# Patient Record
Sex: Male | Born: 2000 | Race: White | Marital: Single | Smoking: Never smoker
Health system: Southern US, Community
[De-identification: ages and names within clinical notes are randomized; demographics above are authoritative.]

## PROBLEM LIST (undated history)

## (undated) DIAGNOSIS — R1310 Dysphagia, unspecified: Secondary | ICD-10-CM

## (undated) DIAGNOSIS — K200 Eosinophilic esophagitis: Secondary | ICD-10-CM

## (undated) DIAGNOSIS — F988 Other specified behavioral and emotional disorders with onset usually occurring in childhood and adolescence: Secondary | ICD-10-CM

## (undated) DIAGNOSIS — F909 Attention-deficit hyperactivity disorder, unspecified type: Secondary | ICD-10-CM

## (undated) HISTORY — DX: Attention-deficit hyperactivity disorder, unspecified type: F90.9

## (undated) HISTORY — DX: Dysphagia, unspecified: R13.10

## (undated) HISTORY — DX: Eosinophilic esophagitis: K20.0

## (undated) HISTORY — DX: Other specified behavioral and emotional disorders with onset usually occurring in childhood and adolescence: F98.8

---
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
## 2022-10-27 ENCOUNTER — Emergency Department (HOSPITAL_COMMUNITY)
Admission: EM | Admit: 2022-10-27 | Discharge: 2022-10-27 | Disposition: A | Discharge status: Home / Self Care | Payer: BLUE CROSS/BLUE SHIELD | Attending: Emergency Medicine | Admitting: Emergency Medicine

## 2022-10-27 ENCOUNTER — Other Ambulatory Visit: Payer: Self-pay

## 2022-10-27 DIAGNOSIS — K200 Eosinophilic esophagitis: Secondary | ICD-10-CM

## 2022-10-27 DIAGNOSIS — X58XXXA Exposure to other specified factors, initial encounter: Secondary | ICD-10-CM | POA: Insufficient documentation

## 2022-10-27 DIAGNOSIS — T18128A Food in esophagus causing other injury, initial encounter: Secondary | ICD-10-CM

## 2022-10-27 MED ORDER — PANTOPRAZOLE SODIUM 40 MG PO TBEC: 40.0000 mg | DELAYED_RELEASE_TABLET | Freq: Every day | ORAL | 1 refills | Status: DC

## 2022-10-27 NOTE — Discharge Instructions (Signed)
Oftentimes with your condition you will have difficulty swallowing food, I would strongly recommend that you take a soft diet, chew up your food extremely well and take lots of water to help wash it down.  You have a condition called eosinophilic esophagitis which is somewhat chronic, you will need to start taking a medication called Protonix which I have sent to your pharmacy, take 1 pill a day, take it at the same time every day.  Please call the office of Dr. Jena Gauss, he is the gastroenterologist on-call today and will be able to follow with you in the office.  Emergency department for severe or worsening symptoms

## 2022-10-27 NOTE — ED Triage Notes (Addendum)
Pt states he was eating a chicken enchilada when some got stuck in is throat. Pt is able to talk in full sentences and denies any difficulty breathing but cannot swallow. Reports he has his esophagus stretched in 2016.

## 2022-10-27 NOTE — ED Notes (Signed)
ED Provider at bedside. 

## 2022-10-27 NOTE — ED Provider Notes (Signed)
[place] [facility] AT [facility] Provider Note   CSN: [id number] Arrival date & time: 10/27/22  1929     History  No chief complaint on file.   Kyle Everett is a 22 y.o. male.  HPI   22 year old male history of prior esophageal dilation for stricture, seen by Dr. Dionicia Abler in 2016 after having 10 months of solid food dysphagia, no obvious narrowing or stricture was noted, the patient had what appeared to be esophageal eosinophilic esophagitis without a dominant stricture or narrowing, he did undergo an dilation with a 48 [place] Maloney dilator at that time.  He states that he required dilation again in 2020 when he was living in [place].  Tonight he was trying to eat chicken enchiladas, when he chewed it up well and swallowed it he felt like it got stuck in his throat and he has not been able to swallow even his saliva since that time.  Is been about 2 hours since that occurred.  He denies any other symptoms  Home Medications Prior to Admission medications   Medication Sig Start Date End Date Taking? Authorizing Provider  pantoprazole (PROTONIX) 40 MG tablet Take 1 tablet (40 mg total) by mouth daily. 10/27/22 12/26/22 Yes Eber Hong, MD      Allergies    Patient has no known allergies.    Review of Systems   Review of Systems  All other systems reviewed and are negative.   Physical Exam Updated Vital Signs BP (!) 129/90   Pulse 85   Temp 97.8 F (36.6 C)   Resp 18   Ht 1.753 m (5\' 9" )   Wt 90.3 kg   SpO2 98%   BMI 29.39 kg/m  Physical Exam Vitals and nursing note reviewed.  Constitutional:      General: He is not in acute distress.    Appearance: He is well-developed.  HENT:     Head: Normocephalic and atraumatic.     Mouth/Throat:     Pharynx: No oropharyngeal exudate.  Eyes:     General: No scleral icterus.       Right eye: No discharge.        Left eye: No discharge.     Conjunctiva/sclera: Conjunctivae normal.     Pupils: Pupils are  equal, round, and reactive to light.  Neck:     Thyroid: No thyromegaly.     Vascular: No JVD.  Cardiovascular:     Rate and Rhythm: Normal rate and regular rhythm.     Heart sounds: Normal heart sounds. No murmur heard.    No friction rub. No gallop.  Pulmonary:     Effort: Pulmonary effort is normal. No respiratory distress.     Breath sounds: Normal breath sounds. No wheezing or rales.  Abdominal:     General: Bowel sounds are normal. There is no distension.     Palpations: Abdomen is soft. There is no mass.     Tenderness: There is no abdominal tenderness.  Musculoskeletal:        General: No tenderness. Normal range of motion.     Cervical back: Normal range of motion and neck supple.     Right lower leg: No edema.     Left lower leg: No edema.  Lymphadenopathy:     Cervical: No cervical adenopathy.  Skin:    General: Skin is warm and dry.     Findings: No erythema or rash.  Neurological:     Mental Status: He is  alert.     Coordination: Coordination normal.  Psychiatric:        Behavior: Behavior normal.     ED Results / Procedures / Treatments   Labs (all labs ordered are listed, but only abnormal results are displayed) Labs Reviewed - No data to display  EKG None  Radiology No results found.  Procedures NG placement  Date/Time: 10/27/2022 8:04 PM  Performed by: Eber Hong, MD Authorized by: Eber Hong, MD  Consent: Verbal consent obtained. Risks and benefits: risks, benefits and alternatives were discussed Consent given by: patient Required items: required blood products, implants, devices, and special equipment available Patient identity confirmed: verbally with patient Time out: Immediately prior to procedure a "time out" was called to verify the correct patient, procedure, equipment, support staff and site/side marked as required. Local anesthesia used: no  Anesthesia: Local anesthesia used: no  Sedation: Patient sedated: no  Patient  tolerance: patient tolerated the procedure well with no immediate complications Comments: G-tube was placed and then removed successfully        Medications Ordered in ED Medications - No data to display  ED Course/ Medical Decision Making/ A&P                             Medical Decision Making Risk Prescription drug management.    This patient presents to the ED for concern of esophageal foreign body differential diagnosis includes stricture, eosinophilic esophagitis    Additional history obtained:  Additional history obtained from medical record External records from outside source obtained and reviewed including prior endoscopy notes from 2016   Lab Tests:  N/a  Imaging Studies ordered:  N/a   Medicines ordered and prescription drug management:  Prescribed Protonix I have reviewed the patients home medicines and have made adjustments as needed   Problem List / ED Course:  The patient was placed in a semirecumbent position, he agreed to attempt at NG tube manipulation of the food bolus.  On the first attempt going through the right nostril the patient was able to allow me to pass the NG tube into the esophagus, the NG tube was removed and the patient is now able to tolerate liquids, he has had a full glass of water with no nausea, he is tolerating secretions, feels like the foreign body is gone and feels better.  He request to follow-up in the outpatient setting, I think this is reasonable Given GI phone number and prescription for pantoprazole   Social Determinants of Health:  Known eosinophilic esophagitis           Final Clinical Impression(s) / ED Diagnoses Final diagnoses:  Eosinophilic esophagitis  Food impaction of esophagus, initial encounter    Rx / DC Orders ED Discharge Orders          Ordered    pantoprazole (PROTONIX) 40 MG tablet  Daily        10/27/22 1958              Eber Hong, MD 10/27/22 2004

## 2022-10-27 NOTE — ED Notes (Signed)
Pt in attached bathroom, clearing his throat. States he feels much better now.

## 2022-11-05 ENCOUNTER — Telehealth (INDEPENDENT_AMBULATORY_CARE_PROVIDER_SITE_OTHER): Payer: Self-pay | Admitting: Gastroenterology

## 2022-11-05 ENCOUNTER — Emergency Department (HOSPITAL_COMMUNITY)
Admission: EM | Admit: 2022-11-05 | Discharge: 2022-11-05 | Disposition: A | Discharge status: Home / Self Care | Payer: Self-pay | Attending: Emergency Medicine | Admitting: Emergency Medicine

## 2022-11-05 ENCOUNTER — Other Ambulatory Visit: Payer: Self-pay

## 2022-11-05 ENCOUNTER — Encounter (HOSPITAL_COMMUNITY): Payer: Self-pay

## 2022-11-05 DIAGNOSIS — T18120A Food in esophagus causing compression of trachea, initial encounter: Secondary | ICD-10-CM | POA: Insufficient documentation

## 2022-11-05 DIAGNOSIS — W44F3XA Food entering into or through a natural orifice, initial encounter: Secondary | ICD-10-CM | POA: Insufficient documentation

## 2022-11-05 LAB — CBC WITH DIFFERENTIAL/PLATELET
Abs Immature Granulocytes: 0.02 10*3/uL (ref 0.00–0.07)
Basophils Absolute: 0.1 10*3/uL (ref 0.0–0.1)
Basophils Relative: 1 %
Eosinophils Absolute: 0.8 10*3/uL — ABNORMAL HIGH (ref 0.0–0.5)
Eosinophils Relative: 8 %
HCT: 44.7 % (ref 39.0–52.0)
Hemoglobin: 15.1 g/dL (ref 13.0–17.0)
Immature Granulocytes: 0 %
Lymphocytes Relative: 27 %
Lymphs Abs: 2.7 10*3/uL (ref 0.7–4.0)
MCH: 29.3 pg (ref 26.0–34.0)
MCHC: 33.8 g/dL (ref 30.0–36.0)
MCV: 86.6 fL (ref 80.0–100.0)
Monocytes Absolute: 0.6 10*3/uL (ref 0.1–1.0)
Monocytes Relative: 6 %
Neutro Abs: 5.6 10*3/uL (ref 1.7–7.7)
Neutrophils Relative %: 58 %
Platelets: 272 10*3/uL (ref 150–400)
RBC: 5.16 MIL/uL (ref 4.22–5.81)
RDW: 12.8 % (ref 11.5–15.5)
WBC: 9.8 10*3/uL (ref 4.0–10.5)
nRBC: 0 % (ref 0.0–0.2)

## 2022-11-05 LAB — BASIC METABOLIC PANEL
Anion gap: 6 (ref 5–15)
BUN: 7 mg/dL (ref 6–20)
CO2: 25 mmol/L (ref 22–32)
Calcium: 9 mg/dL (ref 8.9–10.3)
Chloride: 106 mmol/L (ref 98–111)
Creatinine, Ser: 0.89 mg/dL (ref 0.61–1.24)
GFR, Estimated: >60 mL/min (ref >60)
Glucose, Bld: 94 mg/dL (ref 70–99)
Potassium: 4.1 mmol/L (ref 3.5–5.1)
Sodium: 137 mmol/L (ref 135–145)

## 2022-11-05 MED ORDER — PANTOPRAZOLE SODIUM 40 MG PO TBEC: 40.0000 mg | DELAYED_RELEASE_TABLET | Freq: Two times a day (BID) | ORAL | 1 refills | Status: DC

## 2022-11-05 MED ORDER — GLUCAGON HCL RDNA (DIAGNOSTIC) 1 MG IJ SOLR
1.0000 mg | Freq: Once | INTRAMUSCULAR | Status: AC
  Administered 2022-11-05: 1 mg via INTRAVENOUS
  Filled 2022-11-05: qty 1

## 2022-11-05 NOTE — Telephone Encounter (Signed)
Thanks

## 2022-11-05 NOTE — ED Triage Notes (Signed)
Pt reports hx of esophageal stretching, ate nutella cookies about 4am this morning and feels like it is stuck.

## 2022-11-05 NOTE — Discharge Instructions (Addendum)
It was a pleasure taking care of you today!   Your labs didn't show any concerning findings at this time.   It is important that you call the GI specialist at [facility] today to set up an appointment within the next week.   Also, you will take your protonix twice daily. Ensure that you maintain a liquid diet consistent of water, tea, broth, pedialyte, gatorade. It is recommended that you refrain from solids at this time until you are evaluated by the GI specialist.   Return to the ED if you are experiencing increasing/worsening symptoms.

## 2022-11-05 NOTE — ED Notes (Signed)
Pt given water to see if he can tolerate PO liquids.

## 2022-11-05 NOTE — ED Notes (Signed)
Pt denies any difficulty breathing, able to talk in complete sentences. Speech is clear, respirations regular and unlabored.

## 2022-11-05 NOTE — Telephone Encounter (Signed)
Hi Mitzie,  Can you please schedule a NEW PATIENT appointment for this patient in an appointment withing 7 days with Dr. Tasia Catchings, Marletta Lor or me?  Thanks,  Katrinka Blazing, MD Gastroenterology and Hepatology [facility]

## 2022-11-05 NOTE — ED Provider Notes (Signed)
[place] EMERGENCY DEPARTMENT AT [facility] Provider Note   CSN: [id number] Arrival date & time: 11/05/22  1009     History  Chief Complaint  Patient presents with   food stuck in throat    Kyle Everett is a 22 y.o. male who presents to the ED with concerns for food bolus onset 3 AM. Pt notes that he ate nutella cookies at 3 AM and noted concerns for food bolus. Has been able to tolerate fluids. No meds tried PTA. Denies shortness of breath, sore throat.   The history is provided by the patient. No language interpreter was used.       Home Medications Prior to Admission medications   Medication Sig Start Date End Date Taking? Authorizing Provider  pantoprazole (PROTONIX) 40 MG tablet Take 1 tablet (40 mg total) by mouth 2 (two) times daily. 11/05/22 01/04/23  Estephany Perot A, PA-C      Allergies    Patient has no known allergies.    Review of Systems   Review of Systems  All other systems reviewed and are negative.   Physical Exam Updated Vital Signs BP 135/87 (BP Location: Right Arm)   Pulse (!) 49   Temp 97.9 F (36.6 C) (Oral)   Resp 19   Ht 5\' 9"  (1.753 m)   Wt 89.8 kg   SpO2 100%   BMI 29.24 kg/m  Physical Exam Vitals and nursing note reviewed.  Constitutional:      General: He is not in acute distress.    Appearance: Normal appearance.  HENT:     Mouth/Throat:     Comments: Uvula midline without swelling. No posterior pharyngeal erythema or tonsillar exudate noted. Patent airway. Pt able to speak in clear complete sentences. Tolerating oral secretions. Eyes:     General: No scleral icterus.    Extraocular Movements: Extraocular movements intact.  Cardiovascular:     Rate and Rhythm: Normal rate and regular rhythm.     Pulses: Normal pulses.     Heart sounds: Normal heart sounds.  Pulmonary:     Effort: Pulmonary effort is normal. No respiratory distress.     Breath sounds: Normal breath sounds.     Comments: Able to speak in clear  complete sentences.  Abdominal:     Palpations: Abdomen is soft. There is no mass.     Tenderness: There is no abdominal tenderness.  Musculoskeletal:        General: Normal range of motion.     Cervical back: Neck supple.  Skin:    General: Skin is warm and dry.     Findings: No rash.  Neurological:     Mental Status: He is alert.     Sensory: Sensation is intact.     Motor: Motor function is intact.  Psychiatric:        Behavior: Behavior normal.     ED Results / Procedures / Treatments   Labs (all labs ordered are listed, but only abnormal results are displayed) Labs Reviewed  CBC WITH DIFFERENTIAL/PLATELET - Abnormal; Notable for the following components:      Result Value   Eosinophils Absolute 0.8 (*)    All other components within normal limits  BASIC METABOLIC PANEL    EKG None  Radiology No results found.  Procedures Procedures    Medications Ordered in ED Medications  glucagon (human recombinant) (GLUCAGEN) injection 1 mg (1 mg Intravenous Given 11/05/22 1241)    ED Course/ Medical Decision Making/ A&P Clinical  Course as of 11/05/22 1547  Tue Nov 05, 2022  1236 Pt evaluated and discussed with patient use of glucagon in the emergency department prior to GI consult.  Patient agreeable to glucagon at this time. [SB]  1420 Re-evaluated and noted that he feels the same [SB]  1513 Pt re-evaluated and noted to be able to consume water without difficulty. Discussed with aptient and mother regarding plans for GI consult. Pt and mother agreeable at this itme.  [SB]  1520 Consult to GI, Dr. Levon Hedger who recommends Protonix BID, liquid diet, and follow up in office. [SB]  1527 Discussed with patient plans as per GI specialist. Answered all available questions. Pt appears safe for discharge at this time.  [SB]    Clinical Course User Index [SB] Nolan Tuazon A, PA-C                             Medical Decision Making Amount and/or Complexity of Data  Reviewed Labs: ordered.  Risk Prescription drug management.   Pt presents with concerns for food bolus onset at 3 AM.  Has a history of similar symptoms.  Patient notes that he has been able to tolerate fluids.  Patient afebrile.  On exam patient with Uvula midline without swelling. No posterior pharyngeal erythema or tonsillar exudate noted. Patent airway. Pt able to speak in clear complete sentences. Tolerating oral secretions. Differential diagnosis includes eosinophilic esophagitis, food bolus.    Additional history obtained:  External records from outside source obtained and reviewed including: Patient was evaluated in the emergency department on 10/27/2022 for similar symptoms.  At that time had NG tube placed and removed and was discharged home with information for GI follow-up.  Labs:  I ordered, and personally interpreted labs.  The pertinent results include:   BMP and CBC unremarkable   Medications:  I ordered medication including glucagon for management Reevaluation of the patient after these medicines and interventions, I reevaluated the patient and found that they have stayed the same I have reviewed the patients home medicines and have made adjustments as needed Patient able to tolerate p.o. fluids in the emergency department without difficulty   Consultations: I requested consultation with the Gastroenterologist, and discussed lab and imaging findings as well as pertinent plan - they recommend: Protonix twice daily, liquid diet, follow-up in the office  Disposition: Presentation suspicious for food bolus.  O2 sats at 100%, patient overall well-appearing.  No appreciable shortness of breath.  Patient able to speak in clear complete sentences.  Oropharynx clear. After consideration of the diagnostic results and the patients response to treatment, I feel that the patient would benefit from Discharge home.  Patient instructed to follow-up with [facility] gastroenterology  regarding today's ED visit.  Protonix prescription sent.  Supportive care measures and strict return precautions discussed with patient at bedside. Pt acknowledges and verbalizes understanding. Pt appears safe for discharge. Follow up as indicated in discharge paperwork.    This chart was dictated using voice recognition software, Dragon. Despite the best efforts of this provider to proofread and correct errors, errors may still occur which can change documentation meaning.   Final Clinical Impression(s) / ED Diagnoses Final diagnoses:  Food impaction of esophagus, initial encounter    Rx / DC Orders ED Discharge Orders          Ordered    pantoprazole (PROTONIX) 40 MG tablet  2 times daily  11/05/22 1531              Avy Barlett A, PA-C 11/05/22 1547    Bethann Berkshire, MD 11/06/22 1015

## 2022-11-06 NOTE — Progress Notes (Signed)
Referring Provider:Dr. Estell Harpin Primary Care Physician:  Bennie Pierini, FNP Primary Gastroenterologist:  Dr. Tasia Catchings  Chief Complaint  Patient presents with   Dysphagia    Food is getting stuck in throat. He feels like there is something in there now    HPI:   Kyle Everett is a 22 y.o. male presenting today [facility] the [facility] for dysphagia.  He does have history of dysphagia with food impaction in the past as well, found to have eosinophilic esophagitis in November 2016.   Patient presented to the emergency room 11/05/2022 with concern for food bolus.  Reported eating cookies at 3 AM and had persistent food bolus.  Labs showed mildly elevated absolute eosinophil count of 0.8.  Otherwise, no significant abnormalities.  He was given glucagon injection and was able to tolerate liquids.  Dr. Estell Harpin spoke with Dr. Levon Hedger who recommended Protonix twice daily, liquid diet, follow-up in the office.  Today: Reports he has been doing well until Sunday night when he was eating chicken enchiladas.  Food got stuck in his esophagus and he began to vomit it back up.  Went to the ER.  States Dr. Hyacinth Meeker stuck a tube up his nose and down his throat which moved the food.  States he was able to eat [employer] at night.  Then on Tuesday around 3 AM, he was eating cookies, and they became stuck in his esophagus.  Went to the ER and has only been following a liquid diet since that time.  States even liquids are going down his esophagus slowly.  Denies any history of heartburn.  Denies abdominal pain, BRBPR, melena.   He is taking Protonix. No trouble swallowing this.   Takes Ibuprofen as needed for HA. Used to be frequent.   Last EGD in November 2016 with coarse appearance to the esophageal mucosa with linear furrows but no for circumferential rings noted.  No obvious narrowing or stricture noted and no resistance noted as scope was passed distally.  His esophagus was  dilated by passing 48 [place] Maloney dilator.  Esophageal biopsies consistent with eosinophilic esophagitis.  Ultimately, he was started on montelukast and treated with 6 weeks of fluticasone.     Past Medical History:  Diagnosis Date   ADD (attention deficit disorder)    ADHD (attention deficit hyperactivity disorder)    Dysphagia    Eosinophilic esophagitis     Past Surgical History:  Procedure Laterality Date   BIOPSY  03/10/2015   Procedure: BIOPSY;  Surgeon: Malissa Hippo, MD;  Location: [facility];  Service: Endoscopy;;   ESOPHAGEAL DILATION N/A 03/10/2015   Procedure: ESOPHAGEAL DILATION WITH 48FR MALONEY DILATOR;  Surgeon: Malissa Hippo, MD;  Location: [facility];  Service: Endoscopy;  Laterality: N/A;   ESOPHAGOGASTRODUODENOSCOPY (EGD) WITH PROPOFOL N/A 03/10/2015   Procedure: ESOPHAGOGASTRODUODENOSCOPY (EGD) WITH PROPOFOL;  Surgeon: Malissa Hippo, MD;  Location: [facility];  Service: Endoscopy;  Laterality: N/A;    Current Outpatient Medications  Medication Sig Dispense Refill   pantoprazole (PROTONIX) 40 MG tablet Take 1 tablet (40 mg total) by mouth 2 (two) times daily. 60 tablet 1   No current facility-administered medications for this visit.    Allergies as of 11/07/2022   (No Known Allergies)    Family History  Problem Relation Age of Onset   Diabetes Mother    Healthy Father    ADD / ADHD Sister    Thyroid disease Sister  Social History   Socioeconomic History   Marital status: Single    Spouse name: Not on file   Number of children: Not on file   Years of education: Not on file   Highest education level: Not on file  Occupational History   Not on file  Tobacco Use   Smoking status: Never   Smokeless tobacco: Never  Vaping Use   Vaping status: Every Day  Substance and Sexual Activity   Alcohol use: Not Currently   Drug use: No   Sexual activity: Never    Birth control/protection: None  Other Topics Concern   Not on file  Social History  Narrative   Not on file   Social Determinants of Health   Financial Resource Strain: Not on file  Food Insecurity: Not on file  Transportation Needs: Not on file  Physical Activity: Not on file  Stress: Not on file  Social Connections: Not on file  Intimate Partner Violence: Not on file    Review of Systems: Gen: Denies any fever, chills, cold or flu symptoms, presyncope, syncope. CV: Denies chest pain, heart palpitations. Resp: Denies shortness of breath, cough. GI: See HPI GU : Denies urinary burning, urinary frequency, urinary hesitancy MS: Denies joint pain. Derm: Denies rash. Psych: Denies depression, anxiety.  Heme: See HPI  Physical Exam: BP 120/78 (BP Location: Right Arm, Patient Position: Sitting, Cuff Size: Large)   Pulse 93   Temp 97.7 F (36.5 C) (Temporal)   Ht 5\' 9"  (1.753 m)   Wt 208 lb (94.3 kg)   SpO2 98%   BMI 30.72 kg/m  General:   Alert and oriented. Pleasant and cooperative. Well-nourished and well-developed.  Head:  Normocephalic and atraumatic. Eyes:  Without icterus, sclera clear and conjunctiva pink.  Ears:  Normal auditory acuity. Lungs:  Clear to auscultation bilaterally. No wheezes, rales, or rhonchi. No distress.  Heart:  S1, S2 present without murmurs appreciated.  Abdomen:  +BS, soft, non-tender and non-distended. No HSM noted. No guarding or rebound. No masses appreciated.  Rectal:  Deferred  Msk:  Symmetrical without gross deformities. Normal posture. Extremities:  Without edema. Neurologic:  Alert and  oriented x4;  grossly normal neurologically. Skin:  Intact without significant lesions or rashes. Psych:  Normal mood and affect.    Assessment:  22 year old male with history of eosinophilic esophagitis, presenting today for ER follow-up of dysphagia with food impaction.  He was evaluated in the emergency room 7/16 with concern for food bolus after eating cookies at 3 AM, then developing persistent food bolus. Labs showed mildly  elevated absolute eosinophil count of 0.8.  Otherwise, no significant abnormalities.  He was given glucagon injection and was able to tolerate liquids. He has been following a liquid diet since his ER evaluation and reports even liquids are going down his esophagus slowly, but he has been able to take Protonix without any trouble. Denies any other significant GI symptoms including history of reflux.  He had not been on an acid reflux medication or any other maintenance medications for EOE.  He was previously treated with a short course of fluticasone and montelukast in 2016 by Dr. Karilyn Cota.   I suspect we are dealing with EOE, possible esophageal stricture. For now, will continue PPI twice daily and liquid diet until EGD is completed.    Plan:  Proceed with upper endoscopy with propofol by Dr. Tasia Catchings on 11/08/22. The risks, benefits, and alternatives have been discussed with the patient in detail. The patient states  understanding and desires to proceed.  ASA 2 Continue liquid diet until EGD is complete.  Further recommendations to follow. Continue PPI twice daily for now. Follow-up after EGD.   Ermalinda Memos, PA-C [facility]astroenterology 11/07/2022

## 2022-11-06 NOTE — H&P (View-Only) (Signed)
Referring Provider:Dr. Estell Harpin Primary Care Physician:  Bennie Pierini, FNP Primary Gastroenterologist:  Dr. Tasia Catchings  Chief Complaint  Patient presents with   Dysphagia    Food is getting stuck in throat. He feels like there is something in there now    HPI:   Kyle Everett is a 22 y.o. male presenting today at the request of Dr. Estell Harpin [facility]) for dysphagia.  He does have history of dysphagia with food impaction in the past as well, found to have eosinophilic esophagitis in November 2016.   Patient presented to the emergency room 11/05/2022 with concern for food bolus.  Reported eating cookies at 3 AM and had persistent food bolus.  Labs showed mildly elevated absolute eosinophil count of 0.8.  Otherwise, no significant abnormalities.  He was given glucagon injection and was able to tolerate liquids.  Dr. Estell Harpin spoke with Dr. Levon Hedger who recommended Protonix twice daily, liquid diet, follow-up in the office.  Today: Reports he has been doing well until Sunday night when he was eating chicken enchiladas.  Food got stuck in his esophagus and he began to vomit it back up.  Went to the ER.  States Dr. Hyacinth Meeker stuck a tube up his nose and down his throat which moved the food.  States he was able to eat [employer] at night.  Then on Tuesday around 3 AM, he was eating cookies, and they became stuck in his esophagus.  Went to the ER and has only been following a liquid diet since that time.  States even liquids are going down his esophagus slowly.  Denies any history of heartburn.  Denies abdominal pain, BRBPR, melena.   He is taking Protonix. No trouble swallowing this.   Takes Ibuprofen as needed for HA. Used to be frequent.   Last EGD in November 2016 with coarse appearance to the esophageal mucosa with linear furrows but no for circumferential rings noted.  No obvious narrowing or stricture noted and no resistance noted as scope was passed distally.  His esophagus was  dilated by passing 48 [place] Maloney dilator.  Esophageal biopsies consistent with eosinophilic esophagitis.  Ultimately, he was started on montelukast and treated with 6 weeks of fluticasone.     Past Medical History:  Diagnosis Date   ADD (attention deficit disorder)    ADHD (attention deficit hyperactivity disorder)    Dysphagia    Eosinophilic esophagitis     Past Surgical History:  Procedure Laterality Date   BIOPSY  03/10/2015   Procedure: BIOPSY;  Surgeon: Malissa Hippo, MD;  Location: [facility];  Service: Endoscopy;;   ESOPHAGEAL DILATION N/A 03/10/2015   Procedure: ESOPHAGEAL DILATION WITH 48FR MALONEY DILATOR;  Surgeon: Malissa Hippo, MD;  Location: [facility];  Service: Endoscopy;  Laterality: N/A;   ESOPHAGOGASTRODUODENOSCOPY (EGD) WITH PROPOFOL N/A 03/10/2015   Procedure: ESOPHAGOGASTRODUODENOSCOPY (EGD) WITH PROPOFOL;  Surgeon: Malissa Hippo, MD;  Location: [facility];  Service: Endoscopy;  Laterality: N/A;    Current Outpatient Medications  Medication Sig Dispense Refill   pantoprazole (PROTONIX) 40 MG tablet Take 1 tablet (40 mg total) by mouth 2 (two) times daily. 60 tablet 1   No current facility-administered medications for this visit.    Allergies as of 11/07/2022   (No Known Allergies)    Family History  Problem Relation Age of Onset   Diabetes Mother    Healthy Father    ADD / ADHD Sister    Thyroid disease Sister  Social History   Socioeconomic History   Marital status: Single    Spouse name: Not on file   Number of children: Not on file   Years of education: Not on file   Highest education level: Not on file  Occupational History   Not on file  Tobacco Use   Smoking status: Never   Smokeless tobacco: Never  Vaping Use   Vaping status: Every Day  Substance and Sexual Activity   Alcohol use: Not Currently   Drug use: No   Sexual activity: Never    Birth control/protection: None  Other Topics Concern   Not on file  Social History  Narrative   Not on file   Social Determinants of Health   Financial Resource Strain: Not on file  Food Insecurity: Not on file  Transportation Needs: Not on file  Physical Activity: Not on file  Stress: Not on file  Social Connections: Not on file  Intimate Partner Violence: Not on file    Review of Systems: Gen: Denies any fever, chills, cold or flu symptoms, presyncope, syncope. CV: Denies chest pain, heart palpitations. Resp: Denies shortness of breath, cough. GI: See HPI GU : Denies urinary burning, urinary frequency, urinary hesitancy MS: Denies joint pain. Derm: Denies rash. Psych: Denies depression, anxiety.  Heme: See HPI  Physical Exam: BP 120/78 (BP Location: Right Arm, Patient Position: Sitting, Cuff Size: Large)   Pulse 93   Temp 97.7 F (36.5 C) (Temporal)   Ht 5\' 9"  (1.753 m)   Wt 208 lb (94.3 kg)   SpO2 98%   BMI 30.72 kg/m  General:   Alert and oriented. Pleasant and cooperative. Well-nourished and well-developed.  Head:  Normocephalic and atraumatic. Eyes:  Without icterus, sclera clear and conjunctiva pink.  Ears:  Normal auditory acuity. Lungs:  Clear to auscultation bilaterally. No wheezes, rales, or rhonchi. No distress.  Heart:  S1, S2 present without murmurs appreciated.  Abdomen:  +BS, soft, non-tender and non-distended. No HSM noted. No guarding or rebound. No masses appreciated.  Rectal:  Deferred  Msk:  Symmetrical without gross deformities. Normal posture. Extremities:  Without edema. Neurologic:  Alert and  oriented x4;  grossly normal neurologically. Skin:  Intact without significant lesions or rashes. Psych:  Normal mood and affect.    Assessment:  22 year old male with history of eosinophilic esophagitis, presenting today for ER follow-up of dysphagia with food impaction.  He was evaluated in the emergency room 7/16 with concern for food bolus after eating cookies at 3 AM, then developing persistent food bolus. Labs showed mildly  elevated absolute eosinophil count of 0.8.  Otherwise, no significant abnormalities.  He was given glucagon injection and was able to tolerate liquids. He has been following a liquid diet since his ER evaluation and reports even liquids are going down his esophagus slowly, but he has been able to take Protonix without any trouble. Denies any other significant GI symptoms including history of reflux.  He had not been on an acid reflux medication or any other maintenance medications for EOE.  He was previously treated with a short course of fluticasone and montelukast in 2016 by Dr. Karilyn Cota.   I suspect we are dealing with EOE, possible esophageal stricture. For now, will continue PPI twice daily and liquid diet until EGD is completed.    Plan:  Proceed with upper endoscopy with propofol by Dr. Tasia Catchings on 11/08/22. The risks, benefits, and alternatives have been discussed with the patient in detail. The patient states  understanding and desires to proceed.  ASA 2 Continue liquid diet until EGD is complete.  Further recommendations to follow. Continue PPI twice daily for now. Follow-up after EGD.   Ermalinda Memos, PA-C [facility] 11/07/2022

## 2022-11-07 ENCOUNTER — Encounter (HOSPITAL_COMMUNITY): Payer: Self-pay | Admitting: Anesthesiology

## 2022-11-07 ENCOUNTER — Encounter: Payer: Self-pay | Admitting: Gastroenterology

## 2022-11-07 ENCOUNTER — Encounter: Payer: Self-pay | Admitting: *Deleted

## 2022-11-07 ENCOUNTER — Ambulatory Visit (INDEPENDENT_AMBULATORY_CARE_PROVIDER_SITE_OTHER): Payer: Self-pay | Admitting: Gastroenterology

## 2022-11-07 VITALS — BP 120/78 | HR 93 | Temp 97.7°F | Ht 69.0 in | Wt 208.0 lb

## 2022-11-07 DIAGNOSIS — K200 Eosinophilic esophagitis: Secondary | ICD-10-CM

## 2022-11-07 DIAGNOSIS — R1310 Dysphagia, unspecified: Secondary | ICD-10-CM

## 2022-11-07 NOTE — Patient Instructions (Addendum)
We will arrange to have an upper endoscopy with possible stretching of your esophagus with Dr. Tasia Catchings at [facility].  Take pantoprazole 40 mg twice daily.  Continue liquid diet until EGD is performed. Dr. Tasia Catchings will give additional recommendations after your procedure.   We will follow-up with you in the office after your procedure.  Ermalinda Memos, PA-C [facility] Gastroenterology

## 2022-11-08 DIAGNOSIS — R1310 Dysphagia, unspecified: Secondary | ICD-10-CM

## 2022-11-08 DIAGNOSIS — K200 Eosinophilic esophagitis: Secondary | ICD-10-CM

## 2022-11-11 ENCOUNTER — Encounter: Payer: Self-pay | Admitting: *Deleted

## 2022-11-11 ENCOUNTER — Telehealth: Payer: Self-pay | Admitting: *Deleted

## 2022-11-11 ENCOUNTER — Inpatient Hospital Stay: Payer: Self-pay | Admitting: Gastroenterology

## 2022-11-11 NOTE — Telephone Encounter (Signed)
Spoke with pt. Was supposed to have procedure Friday but was cancelled and needs to be rescheduled due to outage. Advised patient of dates available with Dr. Tasia Catchings.  He is scheduled for 7/25.

## 2022-11-12 ENCOUNTER — Encounter (HOSPITAL_COMMUNITY)
Admission: RE | Admit: 2022-11-12 | Discharge: 2022-11-12 | Disposition: A | Discharge status: Home / Self Care | Payer: Self-pay | Source: Ambulatory Visit | Attending: Gastroenterology | Admitting: Gastroenterology

## 2022-11-14 ENCOUNTER — Ambulatory Visit (HOSPITAL_BASED_OUTPATIENT_CLINIC_OR_DEPARTMENT_OTHER): Payer: Self-pay | Admitting: Certified Registered Nurse Anesthetist

## 2022-11-14 ENCOUNTER — Encounter (HOSPITAL_COMMUNITY): Payer: Self-pay

## 2022-11-14 ENCOUNTER — Encounter (HOSPITAL_COMMUNITY)
Admission: RE | Disposition: A | Discharge status: Home / Self Care | Payer: Self-pay | Source: Home / Self Care | Attending: Gastroenterology

## 2022-11-14 ENCOUNTER — Ambulatory Visit (HOSPITAL_COMMUNITY)
Admission: RE | Admit: 2022-11-14 | Discharge: 2022-11-14 | Disposition: A | Discharge status: Home / Self Care | Payer: Self-pay | Attending: Gastroenterology | Admitting: Gastroenterology

## 2022-11-14 ENCOUNTER — Ambulatory Visit (HOSPITAL_COMMUNITY): Payer: Self-pay | Admitting: Certified Registered Nurse Anesthetist

## 2022-11-14 DIAGNOSIS — K200 Eosinophilic esophagitis: Secondary | ICD-10-CM

## 2022-11-14 DIAGNOSIS — Z79899 Other long term (current) drug therapy: Secondary | ICD-10-CM | POA: Insufficient documentation

## 2022-11-14 DIAGNOSIS — R1310 Dysphagia, unspecified: Secondary | ICD-10-CM | POA: Insufficient documentation

## 2022-11-14 DIAGNOSIS — W44F3XA Food entering into or through a natural orifice, initial encounter: Secondary | ICD-10-CM | POA: Insufficient documentation

## 2022-11-14 HISTORY — PX: BIOPSY: SHX5522

## 2022-11-14 HISTORY — PX: ESOPHAGOGASTRODUODENOSCOPY (EGD) WITH PROPOFOL: SHX5813

## 2022-11-14 HISTORY — PX: SAVORY DILATION: SHX5439

## 2022-11-14 LAB — GLUCOSE, CAPILLARY: Glucose-Capillary: 86 mg/dL (ref 70–99)

## 2022-11-14 SURGERY — ESOPHAGOGASTRODUODENOSCOPY (EGD) WITH PROPOFOL
Anesthesia: General

## 2022-11-14 MED ORDER — PANTOPRAZOLE SODIUM 40 MG PO TBEC: 40.0000 mg | DELAYED_RELEASE_TABLET | Freq: Two times a day (BID) | ORAL | 1 refills | Status: DC

## 2022-11-14 MED ORDER — PROPOFOL 10 MG/ML IV BOLUS
INTRAVENOUS | Status: DC | PRN
  Administered 2022-11-14: 40 mg via INTRAVENOUS
  Administered 2022-11-14: 110 mg via INTRAVENOUS
  Administered 2022-11-14: 30 mg via INTRAVENOUS
  Administered 2022-11-14: 50 mg via INTRAVENOUS
  Administered 2022-11-14: 30 mg via INTRAVENOUS
  Administered 2022-11-14 (×2): 50 mg via INTRAVENOUS
  Administered 2022-11-14: 90 mg via INTRAVENOUS

## 2022-11-14 MED ORDER — LIDOCAINE HCL 1 % IJ SOLN
INTRAMUSCULAR | Status: DC | PRN
  Administered 2022-11-14: 50 mg via INTRADERMAL

## 2022-11-14 MED ORDER — FLUTICASONE PROPIONATE HFA 220 MCG/ACT IN AERO: 4.0000 | INHALATION_SPRAY | Freq: Two times a day (BID) | RESPIRATORY_TRACT | 0 refills | Status: DC

## 2022-11-14 MED ORDER — LACTATED RINGERS IV SOLN: INTRAVENOUS | Status: DC

## 2022-11-14 NOTE — Op Note (Signed)
[facility] Patient Name: Kyle Everett Procedure Date: 11/14/2022 2:58 PM MRN: [id number] Date of Birth: December 27, 2000 Attending MD: Sanjuan Dame , MD, [id number] CSN: [id number] Age: 22 Admit Type: Outpatient Procedure:                Upper GI endoscopy Indications:              Eosinophilic esophagitis Providers:                Sanjuan Dame, MD, Angelica Ran, Elinor Parkinson,                            Dyann Ruddle Referring MD:              Medicines:                Monitored Anesthesia Care Complications:            No immediate complications. Estimated blood loss:                            None. Estimated Blood Loss:     Estimated blood loss was minimal. Procedure:                Pre-Anesthesia Assessment:                           - Prior to the procedure, a History and Physical                            was performed, and patient medications and                            allergies were reviewed. The patient's tolerance of                            previous anesthesia was also reviewed. The risks                            and benefits of the procedure and the sedation                            options and risks were discussed with the patient.                            All questions were answered, and informed consent                            was obtained. Prior Anticoagulants: The patient has                            taken no anticoagulant or antiplatelet agents. ASA                            Grade Assessment: II - A patient with mild systemic  disease. After reviewing the risks and benefits,                            the patient was deemed in satisfactory condition to                            undergo the procedure.                           After obtaining informed consent, the endoscope was                            passed under direct vision. Throughout the                            procedure, the patient's blood pressure,  pulse, and                            oxygen saturations were monitored continuously. The                            [id number]-H190 ([id number]) scope was introduced through the                            mouth, and advanced to the second part of duodenum.                            The upper GI endoscopy was accomplished without                            difficulty. The patient tolerated the procedure                            well. Scope In: 3:15:14 PM Scope Out: 3:31:11 PM Total Procedure Duration: 0 hours 15 minutes 57 seconds  Findings:      Mucosal changes including ringed esophagus, longitudinal furrows,       small-caliber esophagus and mucosal friability were found in the entire       esophagus. Esophageal findings were graded using the Eosinophilic       Esophagitis Endoscopic Reference Score (EoE-EREFS) as: Edema Grade 1       Present (decreased clarity or absence of vascular markings), Rings Grade       2 Moderate (distinct rings that do not occlude passage of diagnostic       8-10 mm endoscope), Exudates Grade 1 Mild (scattered white lesions       involving less than 10 percent of the esophageal surface area), Furrows       Grade 1 Mild (vertical lines without visible depth) and Stricture       present (11 mm luminal diameter). Biopsies were obtained from the       proximal and distal esophagus with cold forceps for histology of       suspected eosinophilic esophagitis. A guidewire was placed and the scope       was withdrawn. Dilation was performed with a Savary dilator with no  resistance at 36 Fr. The dilation site was examined following endoscope       reinsertion and showed moderate mucosal disruption, moderate improvement       in luminal narrowing and no bleeding, mucosal tear or perforation.      The entire examined stomach was normal.      The duodenal bulb and second portion of the duodenum were normal. Impression:               - Esophageal mucosal changes secondary  to                            eosinophilic esophagitis. Dilated.                           - Normal stomach.                           - Normal duodenal bulb and second portion of the                            duodenum.                           - Biopsies were taken with a cold forceps for                            evaluation of eosinophilic esophagitis. Moderate Sedation:      Per Anesthesia Care Recommendation:           - Patient has a contact number available for                            emergencies. The signs and symptoms of potential                            delayed complications were discussed with the                            patient. Return to normal activities tomorrow.                            Written discharge instructions were provided to the                            patient.                           - Resume previous diet.                           - Await pathology results.                           - Repeat upper endoscopy for surveillance based on                            pathology results.                           -  Pantoprazole BID 40mg                            -For eosinophilic esophagitis: Take two puffs of                            Fluticasone in your mouth (DO NOT INHALE it), mix                            with a small sip of water in your mouth and then                            swallow. Gargle your mouth and spit it out. Do not                            eat or drink anything for 30-35minutes. Do this                            twice a day.                           - Return to GI clinic in 6 weeks. Procedure Code(s):        --- Professional ---                           [phone], Esophagogastroduodenoscopy, flexible,                            transoral; with insertion of guide wire followed by                            passage of dilator(s) through esophagus over guide                            wire                           43239, 59,  Esophagogastroduodenoscopy, flexible,                            transoral; with biopsy, single or multiple Diagnosis Code(s):        --- Professional ---                           K20.0, Eosinophilic esophagitis CPT copyright 2022 [employer]. All rights reserved. The codes documented in this report are preliminary and upon coder review may  be revised to meet current compliance requirements. Sanjuan Dame, MD Sanjuan Dame, MD 11/14/2022 3:48:06 PM This report has been signed electronically. Number of Addenda: 0

## 2022-11-14 NOTE — Discharge Instructions (Signed)
EGD Discharge instructions Please read the instructions outlined below and refer to this sheet in the next few weeks. These discharge instructions provide you with general information on caring for yourself after you leave the hospital. Your doctor may also give you specific instructions. While your treatment has been planned according to the most current medical practices available, unavoidable complications occasionally occur. If you have any problems or questions after discharge, please call your doctor. ACTIVITY You may resume your regular activity but move at a slower pace for the next 24 hours.  Take frequent rest periods for the next 24 hours.  Walking will help expel (get rid of) the air and reduce the bloated feeling in your abdomen.  No driving for 24 hours (because of the anesthesia (medicine) used during the test).  You may shower.  Do not sign any important legal documents or operate any machinery for 24 hours (because of the anesthesia used during the test).  NUTRITION Drink plenty of fluids.  You may resume your normal diet.  Begin with a light meal and progress to your normal diet.  Avoid alcoholic beverages for 24 hours or as instructed by your caregiver.  MEDICATIONS You may resume your normal medications unless your caregiver tells you otherwise.  WHAT YOU CAN EXPECT TODAY You may experience abdominal discomfort such as a feeling of fullness or "gas" pains.  FOLLOW-UP Your doctor will discuss the results of your test with you.  SEEK IMMEDIATE MEDICAL ATTENTION IF ANY OF THE FOLLOWING OCCUR: Excessive nausea (feeling sick to your stomach) and/or vomiting.  Severe abdominal pain and distention (swelling).  Trouble swallowing.  Temperature over 101 F (37.8 C).  Rectal bleeding or vomiting of blood.    pantoprazole 40 mg twice daily.  Avoid all NSAIDs.   -For eosinophilic esophagitis:  Take two puffs of Fluticasone in your mouth (DO NOT INHALE it), mix with a small sip  of water in your mouth and then swallow.  Gargle your mouth and spit it out.  Do not eat or drink anything for 30-52minutes.  Do this twice a day.  Follow-up in GI office in 6 to 8 weeks       I hope you have a great rest of your week!   Vista Lawman , M.D.. Gastroenterology and Hepatology [facility]

## 2022-11-14 NOTE — Transfer of Care (Signed)
Immediate Anesthesia Transfer of Care Note  Patient: Kyle Everett  Procedure(s) Performed: ESOPHAGOGASTRODUODENOSCOPY (EGD) WITH PROPOFOL BIOPSY SAVORY DILATION  Patient Location: Short Stay  Anesthesia Type:General  Level of Consciousness: awake  Airway & Oxygen Therapy: Patient Spontanous Breathing  Post-op Assessment: Report given to RN  Post vital signs: Reviewed and stable  Last Vitals:  Vitals Value Taken Time  BP    Temp    Pulse    Resp    SpO2      Last Pain:  Vitals:   11/14/22 1509  PainSc: 0-No pain         Complications: No notable events documented.

## 2022-11-14 NOTE — Interval H&P Note (Signed)
History and Physical Interval Note:  11/14/2022 2:58 PM  Kyle Everett  has presented today for surgery, with the diagnosis of dysphagia,EOE.  The various methods of treatment have been discussed with the patient and family. After consideration of risks, benefits and other options for treatment, the patient has consented to  Procedure(s) with comments: ESOPHAGOGASTRODUODENOSCOPY (EGD) WITH PROPOFOL (N/A) - 10:30 am, asa 3 BALLOON DILATION (N/A) as a surgical intervention.  The patient's history has been reviewed, patient examined, no change in status, stable for surgery.  I have reviewed the patient's chart and labs.  Questions were answered to the patient's satisfaction.     Juanetta Beets Kortnee Bas

## 2022-11-14 NOTE — Anesthesia Preprocedure Evaluation (Signed)
Anesthesia Evaluation  Patient identified by MRN, date of birth, ID band Patient awake    Reviewed: Allergy & Precautions, H&P , NPO status , Patient's Chart, lab work & pertinent test results, reviewed documented beta blocker date and time   Airway Mallampati: II  TM Distance: >3 FB Neck ROM: full    Dental no notable dental hx.    Pulmonary neg pulmonary ROS   Pulmonary exam normal breath sounds clear to auscultation       Cardiovascular Exercise Tolerance: Good negative cardio ROS  Rhythm:regular Rate:Normal     Neuro/Psych  PSYCHIATRIC DISORDERS      negative neurological ROS  negative psych ROS   GI/Hepatic negative GI ROS, Neg liver ROS,,,  Endo/Other  negative endocrine ROS    Renal/GU negative Renal ROS  negative genitourinary   Musculoskeletal   Abdominal   Peds  Hematology negative hematology ROS (+)   Anesthesia Other Findings   Reproductive/Obstetrics negative OB ROS                             Anesthesia Physical Anesthesia Plan  ASA: 2  Anesthesia Plan: General   Post-op Pain Management:    Induction:   PONV Risk Score and Plan: Propofol infusion  Airway Management Planned:   Additional Equipment:   Intra-op Plan:   Post-operative Plan:   Informed Consent: I have reviewed the patients History and Physical, chart, labs and discussed the procedure including the risks, benefits and alternatives for the proposed anesthesia with the patient or authorized representative who has indicated his/her understanding and acceptance.     Dental Advisory Given  Plan Discussed with: CRNA  Anesthesia Plan Comments:        Anesthesia Quick Evaluation

## 2022-11-14 NOTE — Anesthesia Postprocedure Evaluation (Signed)
Anesthesia Post Note  Patient: Kyle Everett  Procedure(s) Performed: ESOPHAGOGASTRODUODENOSCOPY (EGD) WITH PROPOFOL BIOPSY SAVORY DILATION  Patient location during evaluation: Short Stay Anesthesia Type: General Level of consciousness: awake and alert Pain management: pain level controlled Vital Signs Assessment: post-procedure vital signs reviewed and stable Respiratory status: spontaneous breathing Cardiovascular status: blood pressure returned to baseline and stable Postop Assessment: no apparent nausea or vomiting Anesthetic complications: no   No notable events documented.   Last Vitals:  Vitals:   11/14/22 1344  BP: (!) 134/90  Pulse: (!) 53  Resp: (!) 21  Temp: 36.6 C  SpO2: 97%    Last Pain:  Vitals:   11/14/22 1509  PainSc: 0-No pain                 Fatimah Sundquist

## 2022-11-19 ENCOUNTER — Encounter (HOSPITAL_COMMUNITY): Payer: Self-pay | Admitting: Gastroenterology

## 2022-11-21 ENCOUNTER — Encounter (INDEPENDENT_AMBULATORY_CARE_PROVIDER_SITE_OTHER): Payer: Self-pay | Admitting: *Deleted

## 2022-12-03 ENCOUNTER — Encounter (INDEPENDENT_AMBULATORY_CARE_PROVIDER_SITE_OTHER): Payer: Self-pay | Admitting: Gastroenterology

## 2022-12-04 ENCOUNTER — Telehealth: Payer: Self-pay | Admitting: *Deleted

## 2022-12-04 DIAGNOSIS — K200 Eosinophilic esophagitis: Secondary | ICD-10-CM

## 2022-12-04 NOTE — Telephone Encounter (Signed)
Left message to return call with patient. Med is $18.10 at [facility] pharmacy with good rx card. Need to see which [facility] pt wants med sent to.

## 2022-12-04 NOTE — Telephone Encounter (Signed)
Noted. Toniann Fail, let me know if there is anything you need me to do.

## 2022-12-04 NOTE — Telephone Encounter (Signed)
Pt's mother called and states pt has no insurance and can not afford Fluticsone, it is 180$. Would like something cheaper sent to pharmacy.

## 2022-12-05 NOTE — Telephone Encounter (Signed)
Called pharmacy to see if pt picked up rx since unable to get in touch with pt. [facility] told me they tried good rx card and price is $275 with card. I looked back on good rx website and realized the med looked up the other day for $18 was generic flonase not flovent so the price with good rx is $275. Is there an alternative?

## 2022-12-05 NOTE — Telephone Encounter (Signed)
Left message to return call 

## 2022-12-06 NOTE — Telephone Encounter (Signed)
Hi Kyle Everett   He needs Protonic twice daily  And Fluticasone . Please look for patient assistance   Take two puffs of Fluticasone in your mouth (DO NOT INHALE it), mix with a small sip of water in your mouth and then swallow.  Gargle your mouth and spit it out.  Do not eat or drink anything for 30-67minutes.  Do this twice a day.

## 2022-12-09 NOTE — Telephone Encounter (Signed)
Hi, Dr. Tasia Catchings,   I looked into patient assistance for flovent for this patient and the manufacture stopped making brand name flovent end of 2023. Unfortunately No patient assistance available for generic flovent. Pt will need to pay for med if not able to change. Please advise if another option. Thanks

## 2022-12-11 MED ORDER — BUDESONIDE 2 MG/10ML PO SUSP: 2.0000 mg | Freq: Two times a day (BID) | ORAL | 2 refills | Status: DC

## 2022-12-11 NOTE — Telephone Encounter (Signed)
Left message to return call 

## 2022-12-11 NOTE — Telephone Encounter (Signed)
Hi Kyle Everett   If patient is unable to get Fluticasone, I have prescribed budesonide to be taken as slurry. Please see if this is possible for patient   Instruction :  FDA approved EOE treatment : Budesonide oral suspension is 2 mg, twice daily, for 12 weeks . Take budesonide slowly, over 5 to 10 minutes, and not to eat or drink for 30 minutes after taking the medication. Administered as an oral viscous slurry (4 mg total daily dose, generally divided into twice a day). Viscous budesonide can be compounded by mixing Pulmicort Respules with sucralose (Splenda; 10 1-gram packets per 1 mg of budesonide, creating a volume of approximately 8 mL)  Also continue taking Protonix twice daily  Patient to follow up in clinic and after 8-12 weeks of treatment to be scheduled for endoscopy to assess response to treatment

## 2022-12-11 NOTE — Addendum Note (Signed)
Addended by: Vista Lawman on: 12/11/2022 07:31 AM   Modules accepted: Orders

## 2022-12-13 NOTE — Telephone Encounter (Signed)
Left message to return call 

## 2022-12-16 ENCOUNTER — Ambulatory Visit (INDEPENDENT_AMBULATORY_CARE_PROVIDER_SITE_OTHER): Payer: Self-pay | Admitting: Gastroenterology

## 2022-12-16 NOTE — Telephone Encounter (Signed)
Left message to return call 

## 2022-12-18 ENCOUNTER — Ambulatory Visit (INDEPENDENT_AMBULATORY_CARE_PROVIDER_SITE_OTHER): Payer: Self-pay | Admitting: Gastroenterology

## 2022-12-18 NOTE — Telephone Encounter (Signed)
Kyle Everett

## 2022-12-18 NOTE — Telephone Encounter (Signed)
Patient never returned call to let him know treatment was changed. Patient is on schedule to come in this afternoon.

## 2022-12-18 NOTE — Telephone Encounter (Signed)
Patient No showed

## 2022-12-25 ENCOUNTER — Other Ambulatory Visit (INDEPENDENT_AMBULATORY_CARE_PROVIDER_SITE_OTHER): Payer: Self-pay | Admitting: *Deleted

## 2022-12-25 ENCOUNTER — Encounter (INDEPENDENT_AMBULATORY_CARE_PROVIDER_SITE_OTHER): Payer: Self-pay | Admitting: Gastroenterology

## 2022-12-25 ENCOUNTER — Ambulatory Visit (INDEPENDENT_AMBULATORY_CARE_PROVIDER_SITE_OTHER): Payer: Self-pay | Admitting: Gastroenterology

## 2022-12-25 VITALS — BP 122/82 | HR 66 | Temp 97.8°F | Ht 69.0 in | Wt 202.6 lb

## 2022-12-25 DIAGNOSIS — K200 Eosinophilic esophagitis: Secondary | ICD-10-CM

## 2022-12-25 DIAGNOSIS — R1319 Other dysphagia: Secondary | ICD-10-CM

## 2022-12-25 MED ORDER — BUDESONIDE 2 MG/10ML PO SUSP: 2.0000 mg | Freq: Two times a day (BID) | ORAL | 2 refills | Status: DC

## 2022-12-25 MED ORDER — PANTOPRAZOLE SODIUM 40 MG PO TBEC: 40.0000 mg | DELAYED_RELEASE_TABLET | Freq: Two times a day (BID) | ORAL | 1 refills | Status: DC

## 2022-12-25 NOTE — Patient Instructions (Signed)
It was very nice to meet you today, as dicussed with will plan for the following :  1)  Protonix 40mg  twice daily  2)  Budesonide oral suspension is 2 mg, twice daily, for 12 weeks . Take budesonide slowly, over 5 to 10 minutes, and not to eat or drink for 30 minutes after taking the medication. Administered as an oral viscous slurry (4 mg total daily dose, generally divided into twice a day). Viscous budesonide can be compounded by mixing Pulmicort Respules with sucralose (Splenda; 10 1-gram packets per 1 mg of budesonide, creating a volume of approximately 8 mL)   3)We will perform upper endoscopy after 8 to 12 weeks of therapy to assess endoscopic and histologic response.  4) - Cut food in small pieces and chew food thoroughly to avoid regurgitation episodes.

## 2022-12-25 NOTE — Progress Notes (Signed)
Vista Lawman , M.D. Gastroenterology & Hepatology [facility] Gastroenterology [address] [place], [place] [address] Primary Care Physician: Bennie Pierini, FNP [address] [place] [address]  Chief Complaint: Dysphagia and EOE  History of Present Illness: Kyle Everett is a 22 y.o. male with dysphagia and food impaction who presents for evaluation of endoscopic and biopsy proven eosinophilic esophagitis (EoE)  Patient is known to Dr. Dionicia Abler since age 73 previously was on fluticasone and montelukast.  His symptoms recurred this year and underwent recent upper endoscopy with esophageal biopsies positive for eosinophilic esophagitis  Currently patient is unsure and it is hindering his care.  Patient is only taking Protonix once daily at night with food.  He still has occasional symptoms with swallowing.  He is barely eating regular food for example yesterday although he only has chips.  He would have noodles the other days.  Patient is avoiding most of the food at this time because of fear of infection.   Again patient is only taking Protonix once daily at night..  He was given fluticasone inhaler which she could not afford because of insurance concerns.  Patient was ordered for budesonide Respules to be taken as slurry which  he has not picked up yet  The patient denies having any nausea, vomiting, fever, chills, hematochezia, melena, hematemesis, abdominal distention, abdominal pain, diarrhea, jaundice, pruritus or weight loss.  Last EGD:10/2022  - Esophageal mucosal changes secondary to eosinophilic esophagitis. Dilated. - Normal stomach. - Normal duodenal bulb and second portion of the duodenum. - Biopsies were taken with a cold forceps for evaluation of eosinophilic esophagitis.  Dilation was performed with a Savary dilator with no resistance at 36 Fr. The dilation site was examined following endoscope reinsertion and showed moderate  mucosal disruption, moderate improvement in luminal narrowing and no bleeding, mucosal tear or perforation.  A. ESOPHAGUS, DISTAL, BIOPSY:  - Benign squamous mucosa with increased intraepithelial eosinophils (up  to 20 eosinophils per high-power field.  See comment.   B. ESOPHAGUS, PROXIMAL, BIOPSY:  - Benign squamous mucosa with increased intraepithelial eosinophils (up  to 30 eosinophils per high-power field   FHx: neg for any gastrointestinal/liver disease, no malignancies Social: neg smoking, alcohol or illicit drug use Surgical: no abdominal surgeries  Past Medical History: Past Medical History:  Diagnosis Date   ADD (attention deficit disorder)    ADHD (attention deficit hyperactivity disorder)    Dysphagia    Eosinophilic esophagitis     Past Surgical History: Past Surgical History:  Procedure Laterality Date   BIOPSY  03/10/2015   Procedure: BIOPSY;  Surgeon: Malissa Hippo, MD;  Location: [facility];  Service: Endoscopy;;   BIOPSY  11/14/2022   Procedure: BIOPSY;  Surgeon: Franky Macho, MD;  [facility];  Service: Endoscopy;;   ESOPHAGEAL DILATION N/A 03/10/2015   Procedure: ESOPHAGEAL DILATION WITH 48FR MALONEY DILATOR;  Surgeon: Malissa Hippo, MD;  Location: [facility];  Service: Endoscopy;  Laterality: N/A;   ESOPHAGOGASTRODUODENOSCOPY (EGD) WITH PROPOFOL N/A 03/10/2015   Procedure: ESOPHAGOGASTRODUODENOSCOPY (EGD) WITH PROPOFOL;  Surgeon: Malissa Hippo, MD;  Location: [facility];  Service: Endoscopy;  Laterality: N/A;   ESOPHAGOGASTRODUODENOSCOPY (EGD) WITH PROPOFOL N/A 11/14/2022   Procedure: ESOPHAGOGASTRODUODENOSCOPY (EGD) WITH PROPOFOL;  Surgeon: Franky Macho, MD;  [facility];  Service: Endoscopy;  Laterality: N/A;  10:30 am, asa 3   SAVORY DILATION  11/14/2022   Procedure: SAVORY DILATION;  Surgeon: Franky Macho, MD;  [facility];  Service: Endoscopy;;    Family History: Family History  Problem Relation Age of Onset    Diabetes Mother    Healthy Father    ADD / ADHD Sister    Thyroid disease Sister     Social History: Social History   Tobacco Use  Smoking Status Never  Smokeless Tobacco Never   Social History   Substance and Sexual Activity  Alcohol Use Not Currently   Social History   Substance and Sexual Activity  Drug Use No    Allergies: No Known Allergies  Medications: Current Outpatient Medications  Medication Sig Dispense Refill   Budesonide 2 MG/10ML SUSP Take 2 mg by mouth in the morning and at bedtime. FDA approved EOE treatment : Budesonide oral suspension is 2 mg, twice daily, for 12 weeks . Take budesonide slowly, over 5 to 10 minutes, and not to eat or drink for 30 minutes after taking the medication. Administered as an oral viscous slurry (4 mg total daily dose, generally divided into twice a day). Viscous budesonide can be compounded by mixing Pulmicort Respules with sucralose (Splenda; 10 1-gram packets per 1 mg of budesonide, creating a volume of approximately 8 mL) 120 mL 2   fluticasone (FLOVENT HFA) 220 MCG/ACT inhaler Inhale 4 puffs into the lungs 2 (two) times daily. Two puffs 220 metered inhaler  two puffs swallowed. Rinse mouth out. No food or drink for two hours after administration as feasible 4 each 0   pantoprazole (PROTONIX) 40 MG tablet Take 1 tablet (40 mg total) by mouth 2 (two) times daily. 60 tablet 1   No current facility-administered medications for this visit.    Review of Systems: GENERAL: negative for malaise, night sweats HEENT: No changes in hearing or vision, no nose bleeds or other nasal problems. NECK: Negative for lumps, goiter, pain and significant neck swelling RESPIRATORY: Negative for cough, wheezing CARDIOVASCULAR: Negative for chest pain, leg swelling, palpitations, orthopnea GI: SEE HPI MUSCULOSKELETAL: Negative for joint pain or swelling, back pain, and muscle pain. SKIN: Negative for lesions, rash HEMATOLOGY Negative for  prolonged bleeding, bruising easily, and swollen nodes. ENDOCRINE: Negative for cold or heat intolerance, polyuria, polydipsia and goiter. NEURO: negative for tremor, gait imbalance, syncope and seizures. The remainder of the review of systems is noncontributory.   Physical Exam: There were no vitals taken for this visit. GENERAL: The patient is AO x3, in no acute distress. HEENT: Head is normocephalic and atraumatic. EOMI are intact. Mouth is well hydrated and without lesions. NECK: Supple. No masses LUNGS: Clear to auscultation. No presence of rhonchi/wheezing/rales. Adequate chest expansion HEART: RRR, normal s1 and s2. ABDOMEN: Soft, nontender, no guarding, no peritoneal signs, and nondistended. BS +. No masses. EXTREMITIES: Without any cyanosis, clubbing, rash, lesions or edema. NEUROLOGIC: AOx3, no focal motor deficit. SKIN: no jaundice, no rashes   Imaging/Labs: as above     Latest Ref Rng & Units 11/05/2022   12:46 PM 03/03/2015    4:00 PM  CBC  WBC 4.0 - 10.5 K/uL 9.8  7.6   Hemoglobin 13.0 - 17.0 g/dL 62.1  30.8   Hematocrit 39.0 - 52.0 % 44.7  45.4   Platelets 150 - 400 K/uL 272  224    No results found for: "IRON", "TIBC", "FERRITIN"  I personally reviewed and interpreted the available labs, imaging and endoscopic files.  Impression and Plan: Kyle Everett is a 22 y.o. male with dysphagia and food impaction who presents for evaluation of endoscopic  and biopsy proven eosinophilic esophagitis (EoE)  # Eosinophilic esophagitis (EoE)  Patient has very minimal p.o. intake and takes soft diet which is inadequate for nutritional value as he is eating only chips and noodles  Remains symptomatic and is not taking adequate treatment for eosinophilic esophagitis due to insurance issues which is greatly hindering his care and treatment of underlying disease  Again patient is only taking Protonix once daily at night..  He was given fluticasone inhaler which she could not  afford because of insurance concerns.  Patient was ordered for budesonide Respules to be taken as slurry which  he has not picked up yet  Patient is status post EGD with biopsies 10/2022 with 36 French dilation biopsies positive for distal esophagus of 20 high-power field and proximal 30 high-power field  Recommendations:  -Protonix twice daily   -Budesonide slurry: Budesonide oral suspension is 2 mg, twice daily, for 12 weeks . Take budesonide slowly, over 5 to 10 minutes, and not to eat or drink for 30 minutes after taking the medication. Administered as an oral viscous slurry (4 mg total daily dose, generally divided into twice a day). Viscous budesonide can be compounded by mixing Pulmicort Respules with sucralose (Splenda; 10 1-gram packets per 1 mg of budesonide, creating a volume of approximately 8 mL)   -After 8-12 weeks of treatment to be scheduled for endoscopy to assess response to treatment   - Cut food in small pieces and chew food thoroughly to avoid dysphagia episodes.   -Patient was extensively educated by his underlying condition and the need for continued treatment to reduce inflammation to prevent further stricturing.   -He was given adequate resources also given the videos for instructions on how to make a slurry  All questions were answered.      Vista Lawman, MD Gastroenterology and Hepatology [facility] Gastroenterology   This chart has been completed using [facility] Dictation software, and while attempts have been made to ensure accuracy , certain words and phrases may not be transcribed as intended

## 2023-02-21 ENCOUNTER — Telehealth (INDEPENDENT_AMBULATORY_CARE_PROVIDER_SITE_OTHER): Payer: Self-pay | Admitting: Gastroenterology

## 2023-02-21 NOTE — Telephone Encounter (Signed)
Pt called in and was wanting to know if his EGD could be scheduled sooner that 14 weeks (encounter from from 12/25/22 states EGD after 14 weeks). Pt is going out of town 03/14/23 and wanted to have this done before then. Please advise. Thank you

## 2023-02-24 NOTE — Telephone Encounter (Signed)
Pt contacted. Pt scheduled for 02/27/23 at 1:00pm. Not showing any insurance coverage at this time. Will put pre op on my chart. Instructions sent via my chart.

## 2023-02-24 NOTE — Telephone Encounter (Signed)
Hi , Kyle Everett , YES , we can schedule upper endoscopy , 8 weeks from 12/25/2022. Diagnosis : Eosinophillic esophagitis . Any Room

## 2023-02-25 ENCOUNTER — Encounter (HOSPITAL_COMMUNITY)
Admission: RE | Admit: 2023-02-25 | Discharge: 2023-02-25 | Disposition: A | Discharge status: Home / Self Care | Payer: Self-pay | Source: Ambulatory Visit | Attending: Gastroenterology | Admitting: Gastroenterology

## 2023-02-25 ENCOUNTER — Encounter (HOSPITAL_COMMUNITY): Payer: Self-pay

## 2023-02-25 ENCOUNTER — Other Ambulatory Visit: Payer: Self-pay

## 2023-02-27 ENCOUNTER — Ambulatory Visit (HOSPITAL_COMMUNITY): Payer: Self-pay | Admitting: Certified Registered"

## 2023-02-27 ENCOUNTER — Encounter (HOSPITAL_COMMUNITY)
Admission: RE | Disposition: A | Discharge status: Home / Self Care | Payer: Self-pay | Source: Home / Self Care | Attending: Gastroenterology

## 2023-02-27 ENCOUNTER — Ambulatory Visit (HOSPITAL_COMMUNITY)
Admission: RE | Admit: 2023-02-27 | Discharge: 2023-02-27 | Disposition: A | Discharge status: Home / Self Care | Payer: Self-pay | Attending: Gastroenterology | Admitting: Gastroenterology

## 2023-02-27 ENCOUNTER — Ambulatory Visit (HOSPITAL_BASED_OUTPATIENT_CLINIC_OR_DEPARTMENT_OTHER): Payer: Self-pay | Admitting: Certified Registered"

## 2023-02-27 DIAGNOSIS — K222 Esophageal obstruction: Secondary | ICD-10-CM

## 2023-02-27 DIAGNOSIS — K209 Esophagitis, unspecified without bleeding: Secondary | ICD-10-CM

## 2023-02-27 DIAGNOSIS — R1310 Dysphagia, unspecified: Secondary | ICD-10-CM | POA: Insufficient documentation

## 2023-02-27 DIAGNOSIS — K2289 Other specified disease of esophagus: Secondary | ICD-10-CM | POA: Insufficient documentation

## 2023-02-27 DIAGNOSIS — K200 Eosinophilic esophagitis: Secondary | ICD-10-CM | POA: Insufficient documentation

## 2023-02-27 HISTORY — PX: ESOPHAGOGASTRODUODENOSCOPY (EGD) WITH PROPOFOL: SHX5813

## 2023-02-27 HISTORY — PX: BIOPSY: SHX5522

## 2023-02-27 HISTORY — PX: ESOPHAGEAL DILATION: SHX303

## 2023-02-27 SURGERY — ESOPHAGOGASTRODUODENOSCOPY (EGD) WITH PROPOFOL
Anesthesia: General

## 2023-02-27 MED ORDER — PROPOFOL 500 MG/50ML IV EMUL
INTRAVENOUS | Status: DC | PRN
  Administered 2023-02-27: 250 ug/kg/min via INTRAVENOUS

## 2023-02-27 MED ORDER — DEXMEDETOMIDINE HCL IN NACL 80 MCG/20ML IV SOLN
INTRAVENOUS | Status: AC
  Filled 2023-02-27: qty 20

## 2023-02-27 MED ORDER — LIDOCAINE HCL (PF) 2 % IJ SOLN
INTRAMUSCULAR | Status: DC | PRN
  Administered 2023-02-27: 50 mg via INTRADERMAL

## 2023-02-27 MED ORDER — DEXMEDETOMIDINE HCL IN NACL 80 MCG/20ML IV SOLN
INTRAVENOUS | Status: DC | PRN
Start: 2023-02-27 — End: 2023-02-27
  Administered 2023-02-27: 12 ug via INTRAVENOUS
  Administered 2023-02-27: 8 ug via INTRAVENOUS

## 2023-02-27 MED ORDER — LACTATED RINGERS IV SOLN: INTRAVENOUS | Status: DC | PRN

## 2023-02-27 MED ORDER — PROPOFOL 10 MG/ML IV BOLUS
INTRAVENOUS | Status: DC | PRN
  Administered 2023-02-27 (×2): 50 mg via INTRAVENOUS
  Administered 2023-02-27: 100 mg via INTRAVENOUS

## 2023-02-27 NOTE — Anesthesia Preprocedure Evaluation (Signed)
Anesthesia Evaluation  Patient identified by MRN, date of birth, ID band Patient awake    Reviewed: Allergy & Precautions, H&P , NPO status , Patient's Chart, lab work & pertinent test results, reviewed documented beta blocker date and time   Airway Mallampati: II  TM Distance: >3 FB Neck ROM: full    Dental no notable dental hx.    Pulmonary neg pulmonary ROS   Pulmonary exam normal breath sounds clear to auscultation       Cardiovascular Exercise Tolerance: Good negative cardio ROS  Rhythm:regular Rate:Normal     Neuro/Psych  PSYCHIATRIC DISORDERS      negative neurological ROS  negative psych ROS   GI/Hepatic negative GI ROS, Neg liver ROS,,,  Endo/Other  negative endocrine ROS    Renal/GU negative Renal ROS  negative genitourinary   Musculoskeletal   Abdominal   Peds  Hematology negative hematology ROS (+)   Anesthesia Other Findings   Reproductive/Obstetrics negative OB ROS                             Anesthesia Physical Anesthesia Plan  ASA: 2  Anesthesia Plan: General   Post-op Pain Management:    Induction:   PONV Risk Score and Plan: Propofol infusion  Airway Management Planned:   Additional Equipment:   Intra-op Plan:   Post-operative Plan:   Informed Consent: I have reviewed the patients History and Physical, chart, labs and discussed the procedure including the risks, benefits and alternatives for the proposed anesthesia with the patient or authorized representative who has indicated his/her understanding and acceptance.     Dental Advisory Given  Plan Discussed with: CRNA  Anesthesia Plan Comments:        Anesthesia Quick Evaluation

## 2023-02-27 NOTE — H&P (Signed)
Primary Care Physician:  Bennie Pierini, FNP Primary Gastroenterologist:  Dr. Tasia Catchings  Pre-Procedure History & Physical: HPI:  Kyle Everett is a 22 y.o. male with dysphagia and food impaction who presents for evaluation of endoscopic and biopsy proven eosinophilic esophagitis (EoE)   Patient has been taking PPI BID and Fluticasone BID . He denies any impaction episodes but havent felt any difference   A. ESOPHAGUS, DISTAL, BIOPSY:  - Benign squamous mucosa with increased intraepithelial eosinophils (up  to 20 eosinophils per high-power field.  See comment.   B. ESOPHAGUS, PROXIMAL, BIOPSY:  - Benign squamous mucosa with increased intraepithelial eosinophils (up  to 30 eosinophils per high-power field.  See comment.   Past Medical History:  Diagnosis Date   ADD (attention deficit disorder)    ADHD (attention deficit hyperactivity disorder)    Dysphagia    Eosinophilic esophagitis     Past Surgical History:  Procedure Laterality Date   BIOPSY  03/10/2015   Procedure: BIOPSY;  Surgeon: Malissa Hippo, MD;  Location: [facility];  Service: Endoscopy;;   BIOPSY  11/14/2022   Procedure: BIOPSY;  Surgeon: Franky Macho, MD;  [facility];  Service: Endoscopy;;   ESOPHAGEAL DILATION N/A 03/10/2015   Procedure: ESOPHAGEAL DILATION WITH 48FR MALONEY DILATOR;  Surgeon: Malissa Hippo, MD;  Location: [facility];  Service: Endoscopy;  Laterality: N/A;   ESOPHAGOGASTRODUODENOSCOPY (EGD) WITH PROPOFOL N/A 03/10/2015   Procedure: ESOPHAGOGASTRODUODENOSCOPY (EGD) WITH PROPOFOL;  Surgeon: Malissa Hippo, MD;  Location: [facility];  Service: Endoscopy;  Laterality: N/A;   ESOPHAGOGASTRODUODENOSCOPY (EGD) WITH PROPOFOL N/A 11/14/2022   Procedure: ESOPHAGOGASTRODUODENOSCOPY (EGD) WITH PROPOFOL;  Surgeon: Franky Macho, MD;  [facility];  Service: Endoscopy;  Laterality: N/A;  10:30 am, asa 3   SAVORY DILATION  11/14/2022   Procedure: SAVORY DILATION;  Surgeon: Franky Macho, MD;  [facility];  Service: Endoscopy;;    Prior to Admission medications   Medication Sig Start Date End Date Taking? Authorizing Provider  Budesonide 2 MG/10ML SUSP Take 2 mg by mouth in the morning and at bedtime. FDA approved EOE treatment : Budesonide oral suspension is 2 mg, twice daily, for 12 weeks . Take budesonide slowly, over 5 to 10 minutes, and not to eat or drink for 30 minutes after taking the medication. Administered as an oral viscous slurry (4 mg total daily dose, generally divided into twice a day). Viscous budesonide can be compounded by mixing Pulmicort Respules with sucralose (Splenda; 10 1-gram packets per 1 mg of budesonide, creating a volume of approximately 8 mL) 12/25/22 06/23/23 Yes Cedrik Heindl, Juanetta Beets, MD  pantoprazole (PROTONIX) 40 MG tablet Take 1 tablet (40 mg total) by mouth 2 (two) times daily. 12/25/22 02/27/23 Yes Odas Ozer, Juanetta Beets, MD  fluticasone (FLOVENT HFA) 220 MCG/ACT inhaler Inhale 4 puffs into the lungs 2 (two) times daily. Two puffs 220 metered inhaler  two puffs swallowed. Rinse mouth out. No food or drink for two hours after administration as feasible Patient not taking: Reported on 12/25/2022 11/14/22   Franky Macho, MD    Allergies as of 02/24/2023   (No Known Allergies)    Family History  Problem Relation Age of Onset   Diabetes Mother    Healthy Father    ADD / ADHD Sister    Thyroid disease Sister     Social History   Socioeconomic History   Marital status: Single    Spouse name: Not on file  Number of children: Not on file   Years of education: Not on file   Highest education level: Not on file  Occupational History   Not on file  Tobacco Use   Smoking status: Never   Smokeless tobacco: Never  Vaping Use   Vaping status: Former  Substance and Sexual Activity   Alcohol use: Not Currently   Drug use: No   Sexual activity: Never    Birth control/protection: None  Other Topics Concern   Not on file  Social  History Narrative   Not on file   Social Determinants of Health   Financial Resource Strain: Not on file  Food Insecurity: Not on file  Transportation Needs: Not on file  Physical Activity: Not on file  Stress: Not on file  Social Connections: Not on file  Intimate Partner Violence: Not on file    Review of Systems: See HPI, otherwise negative ROS  Physical Exam: Vital signs in last 24 hours: Temp:  [98.1 F (36.7 C)] 98.1 F (36.7 C) (11/07 1104) Pulse Rate:  [58] 58 (11/07 1104) Resp:  [18] 18 (11/07 1104) BP: (124)/(84) 124/84 (11/07 1104) SpO2:  [97 %] 97 % (11/07 1104)   General:   Alert,  Well-developed, well-nourished, pleasant and cooperative in NAD Head:  Normocephalic and atraumatic. Eyes:  Sclera clear, no icterus.   Conjunctiva pink. Ears:  Normal auditory acuity. Nose:  No deformity, discharge,  or lesions. Msk:  Symmetrical without gross deformities. Normal posture. Extremities:  Without clubbing or edema. Neurologic:  Alert and  oriented x4;  grossly normal neurologically. Skin:  Intact without significant lesions or rashes. Psych:  Alert and cooperative. Normal mood and affect.  Impression/Plan:  Kyle Everett is a 22 y.o. male with dysphagia and food impaction who presents for evaluation of endoscopic and biopsy proven eosinophilic esophagitis (EoE)   Proceed with EGD with biopsies to evaluate response to treatment and possible dilation .  The risks of the procedure including infection, bleed, or perforation as well as benefits, limitations, alternatives and imponderables have been reviewed with the patient. Questions have been answered. All parties agreeable.

## 2023-02-27 NOTE — Discharge Instructions (Addendum)
  Discharge instructions Please read the instructions outlined below and refer to this sheet in the next few weeks. These discharge instructions provide you with general information on caring for yourself after you leave the hospital. Your doctor may also give you specific instructions. While your treatment has been planned according to the most current medical practices available, unavoidable complications occasionally occur. If you have any problems or questions after discharge, please call your doctor. ACTIVITY You may resume your regular activity but move at a slower pace for the next 24 hours.  Take frequent rest periods for the next 24 hours.  Walking will help expel (get rid of) the air and reduce the bloated feeling in your abdomen.  No driving for 24 hours (because of the anesthesia (medicine) used during the test).  You may shower.  Do not sign any important legal documents or operate any machinery for 24 hours (because of the anesthesia used during the test).  NUTRITION Drink plenty of fluids.  You may resume your normal diet.  Begin with a light meal and progress to your normal diet.  Avoid alcoholic beverages for 24 hours or as instructed by your caregiver.  MEDICATIONS You may resume your normal medications unless your caregiver tells you otherwise.  WHAT YOU CAN EXPECT TODAY You may experience abdominal discomfort such as a feeling of fullness or "gas" pains.  FOLLOW-UP Your doctor will discuss the results of your test with you.  SEEK IMMEDIATE MEDICAL ATTENTION IF ANY OF THE FOLLOWING OCCUR: Excessive nausea (feeling sick to your stomach) and/or vomiting.  Severe abdominal pain and distention (swelling).  Trouble swallowing.  Temperature over 101 F (37.8 C).  Rectal bleeding or vomiting of blood.     Liquid diet today [place]  Tomorrow you can progress your diet to a soft diet.   Then the next day may progress to normal diet. I hope you have a great rest of your  week!   Vista Lawman , M.D.. Gastroenterology and Hepatology [facility]

## 2023-02-27 NOTE — Anesthesia Procedure Notes (Signed)
Date/Time: 02/27/2023 11:40 AM  Performed by: Julian Reil, CRNAPre-anesthesia Checklist: Patient identified, Emergency Drugs available, Suction available and Patient being monitored Patient Re-evaluated:Patient Re-evaluated prior to induction Oxygen Delivery Method: Nasal cannula Induction Type: IV induction Placement Confirmation: positive ETCO2

## 2023-02-27 NOTE — Op Note (Signed)
[facility] Patient Name: Kyle Everett Procedure Date: 02/27/2023 11:31 AM MRN: [id number] Date of Birth: 2000-10-12 Attending MD: Sanjuan Dame , MD, [id number] CSN: [id number] Age: 22 Admit Type: Outpatient Procedure:                Upper GI endoscopy Indications:              Follow-up of eosinophilic esophagitis Providers:                Sanjuan Dame, MD, Jannett Celestine, RN, Elinor Parkinson Referring MD:              Medicines:                Monitored Anesthesia Care Complications:            No immediate complications. Estimated Blood Loss:     Estimated blood loss: none. Estimated blood loss                            was minimal. Procedure:                Pre-Anesthesia Assessment:                           - Prior to the procedure, a History and Physical                            was performed, and patient medications and                            allergies were reviewed. The patient's tolerance of                            previous anesthesia was also reviewed. The risks                            and benefits of the procedure and the sedation                            options and risks were discussed with the patient.                            All questions were answered, and informed consent                            was obtained. Prior Anticoagulants: The patient has                            taken no anticoagulant or antiplatelet agents. ASA                            Grade Assessment: II - A patient with mild systemic  disease. After reviewing the risks and benefits,                            the patient was deemed in satisfactory condition to                            undergo the procedure.                           After obtaining informed consent, the endoscope was                            passed under direct vision. Throughout the                            procedure, the patient's blood  pressure, pulse, and                            oxygen saturations were monitored continuously. The                            [id number]-H190 ([id number]) scope was introduced through the                            mouth, and advanced to the second part of duodenum.                            The upper GI endoscopy was accomplished without                            difficulty. The patient tolerated the procedure                            well. Scope In: 11:45:07 AM Scope Out: 11:56:11 AM Total Procedure Duration: 0 hours 11 minutes 4 seconds  Findings:      Mucosal changes including ringed esophagus, feline appearance and       longitudinal furrows were found in the entire esophagus. Esophageal       findings were graded using the Eosinophilic Esophagitis Endoscopic       Reference Score (EoE-EREFS) as: Edema Grade 1 Present (decreased clarity       or absence of vascular markings), Rings Grade 1 Mild (subtle       circumferential ridges seen on esophageal distension), Exudates Grade 1       Mild (scattered white lesions involving less than 10 percent of the       esophageal surface area), Furrows Grade 1 Mild (vertical lines without       visible depth) and Stricture present. Biopsies were obtained from the       proximal and distal esophagus with cold forceps for histology of       suspected eosinophilic esophagitis. A guidewire was placed and the scope       was withdrawn. Dilation was performed with a Savary dilator with       moderate resistance at 18 mm. The dilation site was examined and showed  moderate mucosal disruption and complete resolution of luminal narrowing. Impression:               - Esophageal mucosal changes consistent with                            eosinophilic esophagitis. Dilated.                           - Biopsies were taken with a cold forceps for                            evaluation of eosinophilic esophagitis. Moderate Sedation:      Per Anesthesia  Care Recommendation:           - Patient has a contact number available for                            emergencies. The signs and symptoms of potential                            delayed complications were discussed with the                            patient. Return to normal activities tomorrow.                            Written discharge instructions were provided to the                            patient.                           - Resume previous diet.                           - Continue present medications.                           - Await pathology results.                           - Repeat upper endoscopy for surveillance based on                            pathology results.                           - Return to GI clinic. Procedure Code(s):        --- Professional ---                           [phone], Esophagogastroduodenoscopy, flexible,                            transoral; with insertion of guide wire followed by  passage of dilator(s) through esophagus over guide                            wire                           [id number], 59, Esophagogastroduodenoscopy, flexible,                            transoral; with biopsy, single or multiple Diagnosis Code(s):        --- Professional ---                           K22.89, Other specified disease of esophagus                           K20.0, Eosinophilic esophagitis CPT copyright 2022 American Medical Association. All rights reserved. The codes documented in this report are preliminary and upon coder review may  be revised to meet current compliance requirements. Sanjuan Dame, MD Sanjuan Dame, MD 02/27/2023 12:05:51 PM This report has been signed electronically. Number of Addenda: 0

## 2023-02-27 NOTE — Transfer of Care (Signed)
Immediate Anesthesia Transfer of Care Note  Patient: Kyle Everett  Procedure(s) Performed: ESOPHAGOGASTRODUODENOSCOPY (EGD) WITH PROPOFOL ESOPHAGEAL DILATION BIOPSY  Patient Location: Short Stay  Anesthesia Type:General  Level of Consciousness: awake, alert , oriented, and patient cooperative  Airway & Oxygen Therapy: Patient Spontanous Breathing  Post-op Assessment: Report given to RN, Post -op Vital signs reviewed and stable, and Patient moving all extremities X 4  Post vital signs: Reviewed and stable  Last Vitals:  Vitals Value Taken Time  BP 107/59 02/27/23 1159  Temp 36.6 C 02/27/23 1159  Pulse 86 02/27/23 1159  Resp 16 02/27/23 1159  SpO2 97 % 02/27/23 1159    Last Pain:  Vitals:   02/27/23 1159  TempSrc: Axillary  PainSc: Asleep      Patients Stated Pain Goal: 6 (02/27/23 1104)  Complications: No notable events documented.

## 2023-03-01 NOTE — Anesthesia Postprocedure Evaluation (Signed)
Anesthesia Post Note  Patient: Anari Tinson  Procedure(s) Performed: ESOPHAGOGASTRODUODENOSCOPY (EGD) WITH PROPOFOL ESOPHAGEAL DILATION BIOPSY  Patient location during evaluation: Phase II Anesthesia Type: General Level of consciousness: awake Pain management: pain level controlled Vital Signs Assessment: post-procedure vital signs reviewed and stable Respiratory status: spontaneous breathing and respiratory function stable Cardiovascular status: blood pressure returned to baseline and stable Postop Assessment: no headache and no apparent nausea or vomiting Anesthetic complications: no Comments: Late entry   No notable events documented.   Last Vitals:  Vitals:   02/27/23 1205 02/27/23 1220  BP: (!) 95/57 108/75  Pulse: 68 60  Resp: 16 17  Temp:    SpO2: 96% 98%    Last Pain:  Vitals:   02/27/23 1220  TempSrc:   PainSc: 0-No pain                 Windell Norfolk

## 2023-03-03 ENCOUNTER — Telehealth (INDEPENDENT_AMBULATORY_CARE_PROVIDER_SITE_OTHER): Payer: Self-pay | Admitting: *Deleted

## 2023-03-03 LAB — SURGICAL PATHOLOGY

## 2023-03-03 MED ORDER — NYSTATIN 100000 UNIT/ML MT SUSP: 15.0000 mL | Freq: Three times a day (TID) | OROMUCOSAL | 0 refills | Status: AC

## 2023-03-03 NOTE — Telephone Encounter (Signed)
Had EGD on Thursday. States he is not able to eat. States his throat feels swollen and eating broth and potatoes. Has not tried anything else since he is having a hard time getting down broth and potatoes. Had chest pain on Thursday and Friday after procedure. None since Friday.   [phone]

## 2023-03-03 NOTE — Telephone Encounter (Signed)
Discussed with patient per Dr. Tasia Catchings - I am prescribing him magic mouth wash. Also got this pathology results which are better than before but still have the disease , ask him to continue both PPI and fluticasone swallow  Patient verbalized understanding.

## 2023-03-03 NOTE — Telephone Encounter (Signed)
Left message to return call 

## 2023-03-03 NOTE — Progress Notes (Signed)
Mitzie: Can he see [place] in clinic in 2-3 months  I reviewed the pathology results. Ann, can you send her a letter with the findings as described below please?  Thanks,  Vista Lawman, MD Gastroenterology and Hepatology [facility]  ---------------------------------------------------------------------------------------------  [facility] [address], [address], [place], [place] [address] Phone:  [phone]   03/03/23 [place], [place]   Dear Cherylin Mylar,  I am writing to inform you that the biopsies taken during your recent endoscopic examination showed:  Biopsies shows Improving EOE (eosinophilic esophagitis) as number for eosinophils (inflammatory cells)  have went down but still persists.  At this I recommend continuing Protonix 40 mg daily and Fluticasone twice daily . Will plan to see you in clinic in next few months to assess your symptoms   Please call us at [phone] if you have persistent problems or have questions about your condition that have not been fully answered at this time.  Sincerely,  Vista Lawman, MD Gastroenterology and Hepatology ==================   Distal esophagus : 20 eosinophils per high-power field--->10 eosinophils per high-power field  Proximal Esophagus : 30 eosinophils per high-power field----->15 eosinophils per high-power field

## 2023-03-06 ENCOUNTER — Encounter (HOSPITAL_COMMUNITY): Payer: Self-pay | Admitting: Gastroenterology

## 2023-03-06 ENCOUNTER — Encounter (INDEPENDENT_AMBULATORY_CARE_PROVIDER_SITE_OTHER): Payer: Self-pay | Admitting: *Deleted

## 2023-06-02 ENCOUNTER — Encounter (INDEPENDENT_AMBULATORY_CARE_PROVIDER_SITE_OTHER): Payer: Self-pay | Admitting: Gastroenterology

## 2023-06-02 ENCOUNTER — Ambulatory Visit (INDEPENDENT_AMBULATORY_CARE_PROVIDER_SITE_OTHER): Payer: Self-pay | Admitting: Gastroenterology

## 2023-06-02 VITALS — BP 126/77 | HR 66 | Temp 98.0°F | Ht 68.0 in | Wt 197.2 lb

## 2023-06-02 DIAGNOSIS — R1310 Dysphagia, unspecified: Secondary | ICD-10-CM

## 2023-06-02 DIAGNOSIS — K200 Eosinophilic esophagitis: Secondary | ICD-10-CM

## 2023-06-02 MED ORDER — BUDESONIDE 2 MG/10ML PO SUSP: 2.0000 mg | Freq: Two times a day (BID) | ORAL | 2 refills | Status: DC

## 2023-06-02 MED ORDER — OMEPRAZOLE 40 MG PO CPDR: 40.0000 mg | DELAYED_RELEASE_CAPSULE | Freq: Two times a day (BID) | ORAL | 5 refills | Status: DC

## 2023-06-02 MED ORDER — OMEPRAZOLE 40 MG PO CPDR: 40.0000 mg | DELAYED_RELEASE_CAPSULE | Freq: Two times a day (BID) | ORAL | 5 refills | Status: AC

## 2023-06-02 NOTE — Patient Instructions (Addendum)
 It was very nice to meet you today, as dicussed with will plan for the following :  1) Omeprazole  40mg  twice daily  2) -Budesonide  slurry: Budesonide  oral suspension is 2 mg, twice daily, for 12 weeks . Take budesonide  slowly, over 5 to 10 minutes, and not to eat or drink for 30 minutes after taking the medication. Administered as an oral viscous slurry (4 mg total daily dose, generally divided into twice a day). Viscous budesonide  can be compounded by mixing Pulmicort  Respules with sucralose (Splenda; 10 1-gram packets per 1 mg of budesonide , creating a volume of approximately 8 mL)   3) please call Dupixent my way : [phone] , and we will prescribe through specialty pharmacy   4) Cut food in small pieces and chew food thoroughly to avoid regurgitation episodes.

## 2023-06-02 NOTE — Progress Notes (Signed)
 Aoi Kouns Faizan Glendell Fouse , M.D. Gastroenterology & Hepatology [facility] Gastroenterology [address] [place], [place] [address] Primary Care Physician: Delfina Feller, FNP [address] [place] [address]  Chief Complaint: Dysphagia and EOE  History of Present Illness: Kyle Everett is a 23 y.o. male with dysphagia and food impaction who presents for evaluation of endoscopic and biopsy proven eosinophilic esophagitis (EoE)  Patient was known to Dr. Freddy Jain since age 27 previously was on fluticasone  and montelukast .  His symptoms recurred 2024 and underwent recent upper endoscopy with esophageal biopsies positive for eosinophilic esophagitis  Currently patient is uninsured and it is hindering his care.  Patient is currently not taking anything since last endoscopy as he reports " nothing is working for me".   He is barely eating regular food for example yesterday although he only has mash potatoes.  Patient is avoiding most of the food at this time because of fear of impaction.   Again patient was only taking Protonix  once daily at night..  He was given fluticasone  inhaler which she could not afford because of insurance concerns.  Patient was ordered for budesonide  Respules to be taken as slurry which  he took for 2 months and stopped taking   Last EGD 02/2023  - Esophageal mucosal changes consistent with eosinophilic esophagitis. Dilated. With 18mm , significant renting  Biopsies were taken with a cold forceps for evaluation of eosinophilic esophagitis.  Distal esophagus : 20 eosinophils per high-power field--->10 eosinophils per high-power field   Proximal Esophagus : 30 eosinophils per high-power field----->15 eosinophils per high-power field    EGD:10/2022  - Esophageal mucosal changes secondary to eosinophilic esophagitis. Dilated. - Normal stomach. - Normal duodenal bulb and second portion of the duodenum. - Biopsies were taken with a cold  forceps for evaluation of eosinophilic esophagitis.  Dilation was performed with a Savary dilator with no resistance at 36 Fr. The dilation site was examined following endoscope reinsertion and showed moderate mucosal disruption, moderate improvement in luminal narrowing and no bleeding, mucosal tear or perforation.  A. ESOPHAGUS, DISTAL, BIOPSY:  - Benign squamous mucosa with increased intraepithelial eosinophils (up  to 20 eosinophils per high-power field.  See comment.   B. ESOPHAGUS, PROXIMAL, BIOPSY:  - Benign squamous mucosa with increased intraepithelial eosinophils (up  to 30 eosinophils per high-power field   FHx: neg for any gastrointestinal/liver disease, no malignancies Social: neg smoking, alcohol or illicit drug use Surgical: no abdominal surgeries  Past Medical History: Past Medical History:  Diagnosis Date   ADD (attention deficit disorder)    ADHD (attention deficit hyperactivity disorder)    Dysphagia    Eosinophilic esophagitis     Past Surgical History: Past Surgical History:  Procedure Laterality Date   BIOPSY  03/10/2015   Procedure: BIOPSY;  Surgeon: Ruby Corporal, MD;  Location: [facility];  Service: Endoscopy;;   BIOPSY  11/14/2022   Procedure: BIOPSY;  Surgeon: Hargis Lias, MD;  [facility]: [facility];  Service: Endoscopy;;   BIOPSY  02/27/2023   Procedure: BIOPSY;  Surgeon: Hargis Lias, MD;  [facility]: [facility];  Service: Endoscopy;;   ESOPHAGEAL DILATION N/A 03/10/2015   Procedure: ESOPHAGEAL DILATION WITH 48FR MALONEY DILATOR;  Surgeon: Ruby Corporal, MD;  Location: [facility];  Service: Endoscopy;  Laterality: N/A;   ESOPHAGEAL DILATION  02/27/2023   Procedure: ESOPHAGEAL DILATION;  Surgeon: Hargis Lias, MD;  [facility];  Service: Endoscopy;;   ESOPHAGOGASTRODUODENOSCOPY (EGD) WITH  PROPOFOL  N/A 03/10/2015   Procedure: ESOPHAGOGASTRODUODENOSCOPY (EGD) WITH PROPOFOL ;  Surgeon: Ruby Corporal, MD;  Location: [facility];   Service: Endoscopy;  Laterality: N/A;   ESOPHAGOGASTRODUODENOSCOPY (EGD) WITH PROPOFOL  N/A 11/14/2022   Procedure: ESOPHAGOGASTRODUODENOSCOPY (EGD) WITH PROPOFOL ;  Surgeon: Hargis Lias, MD;  [facility];  Service: Endoscopy;  Laterality: N/A;  10:30 am, asa 3   ESOPHAGOGASTRODUODENOSCOPY (EGD) WITH PROPOFOL  N/A 02/27/2023   Procedure: ESOPHAGOGASTRODUODENOSCOPY (EGD) WITH PROPOFOL ;  Surgeon: Hargis Lias, MD;  [facility];  Service: Endoscopy;  Laterality: N/A;  1:00PM;ASA 1-2   SAVORY DILATION  11/14/2022   Procedure: SAVORY DILATION;  Surgeon: Hargis Lias, MD;  [facility];  Service: Endoscopy;;    Family History: Family History  Problem Relation Age of Onset   Diabetes Mother    Healthy Father    ADD / ADHD Sister    Thyroid disease Sister     Social History: Social History   Tobacco Use  Smoking Status Never  Smokeless Tobacco Never   Social History   Substance and Sexual Activity  Alcohol Use Not Currently   Social History   Substance and Sexual Activity  Drug Use No    Allergies: No Known Allergies  Medications: Current Outpatient Medications  Medication Sig Dispense Refill   Budesonide  2 MG/10ML SUSP Take 2 mg by mouth in the morning and at bedtime. FDA approved EOE treatment : Budesonide  oral suspension is 2 mg, twice daily, for 12 weeks . Take budesonide  slowly, over 5 to 10 minutes, and not to eat or drink for 30 minutes after taking the medication. Administered as an oral viscous slurry (4 mg total daily dose, generally divided into twice a day). Viscous budesonide  can be compounded by mixing Pulmicort  Respules with sucralose (Splenda; 10 1-gram packets per 1 mg of budesonide , creating a volume of approximately 8 mL) 120 mL 2   fluticasone  (FLOVENT  HFA) 220 MCG/ACT inhaler Inhale 4 puffs into the lungs 2 (two) times daily. Two puffs 220 metered inhaler  two puffs swallowed. Rinse mouth out. No food or drink for  two hours after administration as feasible (Patient not taking: Reported on 12/25/2022) 4 each 0   pantoprazole  (PROTONIX ) 40 MG tablet Take 1 tablet (40 mg total) by mouth 2 (two) times daily. 60 tablet 1   No current facility-administered medications for this visit.    Review of Systems: GENERAL: negative for malaise, night sweats HEENT: No changes in hearing or vision, no nose bleeds or other nasal problems. NECK: Negative for lumps, goiter, pain and significant neck swelling RESPIRATORY: Negative for cough, wheezing CARDIOVASCULAR: Negative for chest pain, leg swelling, palpitations, orthopnea GI: SEE HPI MUSCULOSKELETAL: Negative for joint pain or swelling, back pain, and muscle pain. SKIN: Negative for lesions, rash HEMATOLOGY Negative for prolonged bleeding, bruising easily, and swollen nodes. ENDOCRINE: Negative for cold or heat intolerance, polyuria, polydipsia and goiter. NEURO: negative for tremor, gait imbalance, syncope and seizures. The remainder of the review of systems is noncontributory.   Physical Exam: There were no vitals taken for this visit. GENERAL: The patient is AO x3, in no acute distress. HEENT: Head is normocephalic and atraumatic. EOMI are intact. Mouth is well hydrated and without lesions. NECK: Supple. No masses LUNGS: Clear to auscultation. No presence of rhonchi/wheezing/rales. Adequate chest expansion HEART: RRR, normal s1 and s2. ABDOMEN: Soft, nontender, no guarding, no peritoneal signs, and nondistended. BS +. No masses. EXTREMITIES: Without any cyanosis, clubbing, rash, lesions or edema.  NEUROLOGIC: AOx3, no focal motor deficit. SKIN: no jaundice, no rashes   Imaging/Labs: as above     Latest Ref Rng & Units 11/05/2022   12:46 PM 03/03/2015    4:00 PM  CBC  WBC 4.0 - 10.5 K/uL 9.8  7.6   Hemoglobin 13.0 - 17.0 g/dL 16.1  09.6   Hematocrit 39.0 - 52.0 % 44.7  45.4   Platelets 150 - 400 K/uL 272  224    No results found for: "IRON",  "TIBC", "FERRITIN"  I personally reviewed and interpreted the available labs, imaging and endoscopic files.  Impression and Plan: Kyle Everett is a 23 y.o. male with dysphagia and food impaction who presents for evaluation of endoscopic and biopsy proven eosinophilic esophagitis (EoE)  # Eosinophilic esophagitis (EoE)  Patient has very minimal p.o. intake and takes soft diet which is inadequate for nutritional value as he is eating only chips and noodles  Remains symptomatic and is not taking adequate treatment for eosinophilic esophagitis due to insurance issues which is greatly hindering his care and treatment of underlying disease  Patient was taking PPI BID and switched to once daily .  He was given fluticasone  inhaler which she could not afford because of insurance concerns.  Patient was ordered for budesonide  Respules to be taken as slurry which he took for 8-12 weeks without my relief   Patient is status post EGD with biopsies 02/2023 with 18mm  biopsies positive for distal esophagus of 20 high-power field----->10 and proximal 30 high-power field------>15  Recommendations:  -1FED ( food elimination diet ): avoid all cow dairy proteins   -Switch protonix  to omeprazole  BID , 40mg    -Budesonide  slurry: Budesonide  oral suspension is 2 mg, twice daily, for 12 weeks . Take budesonide  slowly, over 5 to 10 minutes, and not to eat or drink for 30 minutes after taking the medication. Administered as an oral viscous slurry (4 mg total daily dose, generally divided into twice a day). Viscous budesonide  can be compounded by mixing Pulmicort  Respules with sucralose (Splenda; 10 1-gram packets per 1 mg of budesonide , creating a volume of approximately 8 mL)   -Given patient is not improving ( continues to be significantly symptomatic with eosinophilia) with PPI and steroids next step is Dupilumab as per ACG guidelines   -After 8-12 weeks of adequate treatment to be scheduled for endoscopy to  assess response to treatment   - Cut food in small pieces and chew food thoroughly to avoid dysphagia episodes.   -Patient was extensively educated by his underlying condition and the need for continued treatment to reduce inflammation to prevent further stricturing.   -He was given adequate resources also given the videos for instructions on how to make a slurry and info on Dupixent ( was given number to patient assistance ) .Dupixent my way : [phone] , and we will prescribe through specialty pharmacy   All questions were answered.      Kyle Verde Faizan Philip Eckersley, MD Gastroenterology and Hepatology [facility] Gastroenterology   This chart has been completed using [facility] Dictation software, and while attempts have been made to ensure accuracy , certain words and phrases may not be transcribed as intended

## 2023-06-03 ENCOUNTER — Ambulatory Visit (INDEPENDENT_AMBULATORY_CARE_PROVIDER_SITE_OTHER): Payer: Self-pay | Admitting: Gastroenterology

## 2023-06-03 ENCOUNTER — Encounter (INDEPENDENT_AMBULATORY_CARE_PROVIDER_SITE_OTHER): Payer: Self-pay | Admitting: *Deleted

## 2023-06-03 ENCOUNTER — Other Ambulatory Visit (INDEPENDENT_AMBULATORY_CARE_PROVIDER_SITE_OTHER): Payer: Self-pay | Admitting: *Deleted

## 2023-06-03 ENCOUNTER — Telehealth (INDEPENDENT_AMBULATORY_CARE_PROVIDER_SITE_OTHER): Payer: Self-pay | Admitting: *Deleted

## 2023-06-03 DIAGNOSIS — K200 Eosinophilic esophagitis: Secondary | ICD-10-CM

## 2023-06-03 MED ORDER — DUPILUMAB 300 MG/2ML ~~LOC~~ SOAJ: 1.0000 | SUBCUTANEOUS | 11 refills | Status: AC

## 2023-06-03 NOTE — Telephone Encounter (Signed)
Dr. Tasia Catchings, I spoke to [facility] ( drug rep for dupixent ) there is patient assistance for this drug and she is going to email the forms. Also she said the medication needs to be sent to [facility] and they need to run it through to get the denial as the first step. I have added the pharmacy to his list. If you want to send it in so we can get the cost of the med to submitt with the paperwork for assistance or if you want to give me the dose and directions you want I can send it in to the specialty pharmacy. Thanks

## 2023-06-03 NOTE — Progress Notes (Deleted)
Dr. Tasia Catchings, I spoke to [facility] ( drug rep for dupixent ) there is patient assistance for this drug and she is going to email the forms. Also she said the medication needs to be sent to [facility] and they need to run it through to get the denial as the first step. I have added the pharmacy to his list. If you want to send it in so we can get the cost of the med to submitt with the paperwork for assistance or if you want to give me the dose and directions you want I can send it in to the specialty pharmacy. Thanks

## 2023-06-03 NOTE — Telephone Encounter (Signed)
HI Toniann Fail I have ordered  1) Dupixent 300mg  weekly injections to [facility]   2) Please let patient know that he needs to do blood work which I have ordered and CAN NOT start unless this blood work is done and is normal ( Viral hep A, B,C and Quantiferon)  3) He must avoid all LIVE vaccinations such as MMR , Yellow pox , live flu vaccine etc  ==============================  Orders Placed This Encounter     Comprehensive metabolic panel     CBC     Hepatitis A antibody, total     Hepatitis B core antibody, total     Hepatitis B surface antibody,qualitative     Hepatitis B surface antigen     Hepatitis C antibody     HIV Antibody (routine testing w rflx)     QuantiFERON-TB Gold Plus

## 2023-07-11 ENCOUNTER — Encounter (INDEPENDENT_AMBULATORY_CARE_PROVIDER_SITE_OTHER): Payer: Self-pay | Admitting: *Deleted

## 2023-07-11 NOTE — Telephone Encounter (Signed)
 Toniann Fail can you please call the patient and remind him, I know he was getting married so probably missed it .

## 2023-07-11 NOTE — Telephone Encounter (Signed)
 FYI - patient has not completed labs in order to go forward with dupixent

## 2023-07-11 NOTE — Telephone Encounter (Signed)
 Tried to call patient and received voicemail. I left on voicemail that I would send him a message to his mychart.

## 2023-07-16 ENCOUNTER — Encounter (INDEPENDENT_AMBULATORY_CARE_PROVIDER_SITE_OTHER): Payer: Self-pay | Admitting: *Deleted

## 2023-07-16 NOTE — Telephone Encounter (Signed)
 Mailed letter with lab order for patient to complete labs if he wants to start dupixent.

## 2023-07-17 ENCOUNTER — Encounter (INDEPENDENT_AMBULATORY_CARE_PROVIDER_SITE_OTHER): Payer: Self-pay | Admitting: Gastroenterology

## 2023-10-27 ENCOUNTER — Telehealth (INDEPENDENT_AMBULATORY_CARE_PROVIDER_SITE_OTHER): Payer: Self-pay

## 2023-10-27 ENCOUNTER — Telehealth (INDEPENDENT_AMBULATORY_CARE_PROVIDER_SITE_OTHER): Payer: Self-pay | Admitting: Gastroenterology

## 2023-10-27 NOTE — Telephone Encounter (Signed)
 Last seen by [name] 06/02/2023: [name] would you mind reaching out to the patient and scheduling him an appointment with [name] or one of the Apps please? Thanks  Note per Patient mother through her My Chart:  My son, Kyle Everett was seen by Dr Golda a few years ago about his throat. He is having more issues and hasn't been able to eat solid food for a few months now and is trying to get an appointment with Dr. Selwyn. Could you please call and schedule him an appointment at [phone].   Thanks for your help!

## 2023-10-27 NOTE — Telephone Encounter (Signed)
 I'm ok with providing a second opinion as long as [name] is [facility] with that

## 2023-10-27 NOTE — Telephone Encounter (Signed)
 I called Kyle Everett to offer him an OV with Dr Cinderella for one day next week. Kyle Everett is wanting to switch from Dr Cinderella to Dr Eartha. Please advise if you are ok with taking him as your Kyle Everett.

## 2023-10-28 NOTE — Telephone Encounter (Signed)
 Yes definetly ,   Dr Eartha, he needs Dupixent  , I had tried patient assistance but I think patient did not follow through

## 2023-10-28 NOTE — Telephone Encounter (Signed)
 Thanks, yes I think that would be a great choice

## 2023-10-28 NOTE — Telephone Encounter (Signed)
 Noted

## 2023-11-03 ENCOUNTER — Encounter (INDEPENDENT_AMBULATORY_CARE_PROVIDER_SITE_OTHER): Payer: Self-pay | Admitting: Gastroenterology

## 2023-11-03 ENCOUNTER — Encounter (INDEPENDENT_AMBULATORY_CARE_PROVIDER_SITE_OTHER): Payer: Self-pay | Admitting: *Deleted

## 2023-11-03 ENCOUNTER — Ambulatory Visit (INDEPENDENT_AMBULATORY_CARE_PROVIDER_SITE_OTHER): Payer: Self-pay | Admitting: Gastroenterology

## 2023-11-03 VITALS — BP 123/72 | HR 81 | Temp 97.1°F | Ht 68.0 in | Wt 172.5 lb

## 2023-11-03 DIAGNOSIS — R1319 Other dysphagia: Secondary | ICD-10-CM

## 2023-11-03 DIAGNOSIS — K222 Esophageal obstruction: Secondary | ICD-10-CM

## 2023-11-03 DIAGNOSIS — K200 Eosinophilic esophagitis: Secondary | ICD-10-CM

## 2023-11-03 NOTE — H&P (View-Only) (Signed)
 Kyle Everett, M.D. Gastroenterology & Hepatology [facility] [address] [place], [place] [address] Primary Care Physician: [name], FNP [address] [place] [address]  Chief Complaint: Eosinophilic esophagitis  History of Present Illness: Kyle Everett is a 23 y.o. male with past medical history of eosinophilic esophagitis, ADHD, who presents for evaluation of eosinophilic esophagitis.  Patient comes to the office requesting a second opinion regarding eosinophilic esophagitis.  The patient is a former patient of [name].  He was first seen in 2016 after he was presenting 29-month history of dysphagia.  His initial biopsy on 02/22/2015 showed coarse appearance to the esophageal mucosa with linear furrows but no for circumferential rings noted. No obvious narrowing or stricture noted and no resistance noted as scope was passed distally. His esophagus was dilated by passing 48 [place] Maloney dilator.  Biopsy showed marked elevation of eosinophils on biopsies.  She was initially started on montelukast  for management of eosinophilic esophagitis.  Was also advised in follow-up appointments to keep an eye out for the type of food to trigger her symptoms.  Patient eventually started seeing [name] since 2020 for for evaluation of dysphagia.  He presented an episode of food impaction and had placement of an NG tube in the ER.  He was discharged on PPI twice daily.  The patient underwent a subsequent EGD on 11/14/2022 for showing an 11 mm stricture along with multiple changes concerning for eosinophilic esophagitis.  Dilation was performed with a savory dilator up to 12 mm.  Biopsies were taken.  Stomach and duodenum were all normal.  He was advised to continue taking pantoprazole  twice a day and to start fluticasone .  Biopsies showed up to the 30 eosinophils in the  proximal esophagus and up to 20 eosinophils  Per high-power field  in the distal esophagus.  A second endoscopy was performed on 02/27/2023.  This showed presence of active eosinophilic esophagitis as there was edema, rings and exudates with first there was also presence of a stricture which was dilated with a  savory dilator up to 18 mm.  Notably, most recent biopsies showed up to 15  eosinophils per high-power field in the proximal esophagus and only up to 10 in the distal esophagus.    The patient was being followed by [name] after his endoscopies with most recent appointment on 06/02/2023.  Per notes, he was given budesonide  capsules  to be taken as a slurry which he took for 2 months but stopped taking afterwards.  He was advised to follow-up 1 food elimination diet  and to try again budesonide  slurry on top of tonics 40 mg twice a day.  Given recurrent issues with dysphagia, patient requested new treatment and he was started on Dupixent  but he never performed blood workup required prior to starting this medication.  He never started Dupixent .  Patient reports that he is not able to eat much of the foods since he had his first endoscopy in 10/2022. He states he is barely able to eat potatoes, soups and  some chips occasionally. Not drinking any protein shakes.  Has tried to avoid diarrhea as much as possible.  The patient denies having any nausea, vomiting, fever, chills, hematochezia, melena, hematemesis, abdominal distention, abdominal pain, diarrhea, jaundice, pruritus. He has lost close to 50 lb in the last year due to poor oral intake.  Past Medical History: Past Medical History:  Diagnosis Date   ADD (attention deficit disorder)  ADHD (attention deficit hyperactivity disorder)    Dysphagia    Eosinophilic esophagitis     Past Surgical History: Past Surgical History:  Procedure Laterality Date   BIOPSY  03/10/2015   Procedure: BIOPSY;  Surgeon: [name], MD;  [facility]: [facility];  Service: Endoscopy;;   BIOPSY  11/14/2022   Procedure:  BIOPSY;  Surgeon: [name], MD;  [facility];  Service: Endoscopy;;   BIOPSY  02/27/2023   Procedure: BIOPSY;  Surgeon: [name], MD;  [facility];  Service: Endoscopy;;   ESOPHAGEAL DILATION N/A 03/10/2015   Procedure: ESOPHAGEAL DILATION WITH 48FR MALONEY DILATOR;  Surgeon: [name], MD;  Location: [facility];  Service: Endoscopy;  Laterality: N/A;   ESOPHAGEAL DILATION  02/27/2023   Procedure: ESOPHAGEAL DILATION;  Surgeon: [name], MD;  [facility];  Service: Endoscopy;;   ESOPHAGOGASTRODUODENOSCOPY (EGD) WITH PROPOFOL  N/A 03/10/2015   Procedure: ESOPHAGOGASTRODUODENOSCOPY (EGD) WITH PROPOFOL ;  Surgeon: [name], MD;  [facility];  Service: Endoscopy;  Laterality: N/A;   ESOPHAGOGASTRODUODENOSCOPY (EGD) WITH PROPOFOL  N/A 11/14/2022   Procedure: ESOPHAGOGASTRODUODENOSCOPY (EGD) WITH PROPOFOL ;  Surgeon: [name], MD;  [facility];  Service: Endoscopy;  Laterality: N/A;  10:30 am, asa 3   ESOPHAGOGASTRODUODENOSCOPY (EGD) WITH PROPOFOL  N/A 02/27/2023   Procedure: ESOPHAGOGASTRODUODENOSCOPY (EGD) WITH PROPOFOL ;  Surgeon: [name], MD;  [facility];  Service: Endoscopy;  Laterality: N/A;  1:00PM;ASA 1-2   SAVORY DILATION  11/14/2022   Procedure: SAVORY DILATION;  Surgeon: [name], MD;  [facility];  Service: Endoscopy;;    Family History: Family History  Problem Relation Age of Onset   Diabetes Mother    Healthy Father    ADD / ADHD Sister    Thyroid disease Sister     Social History: Social History   Tobacco Use  Smoking Status Never  Smokeless Tobacco Never   Social History   Substance and Sexual Activity  Alcohol Use Not Currently   Social History   Substance and Sexual Activity  Drug Use No    Allergies: No Known Allergies  Medications: Current Outpatient Medications  Medication Sig Dispense Refill   Budesonide  2 MG/10ML SUSP  Take 2 mg by mouth in the morning and at bedtime. FDA approved EOE treatment : Budesonide  oral suspension is 2 mg, twice daily, for 12 weeks . Take budesonide  slowly, over 5 to 10 minutes, and not to eat or drink for 30 minutes after taking the medication. Administered as an oral viscous slurry (4 mg total daily dose, generally divided into twice a day). Viscous budesonide  can be compounded by mixing Pulmicort  Respules with sucralose (Splenda; 10 1-gram packets per 1 mg of budesonide , creating a volume of approximately 8 mL) (Patient not taking: Reported on 11/03/2023) 120 mL 2   Dupilumab  300 MG/2ML SOAJ Inject 300 mg into the skin once a week. (Patient not taking: Reported on 11/03/2023) 8 mL 11   fluticasone  (FLOVENT  HFA) 220 MCG/ACT inhaler Inhale 4 puffs into the lungs 2 (two) times daily. Two puffs 220 metered inhaler  two puffs swallowed. Rinse mouth out. No food or drink for two hours after administration as feasible (Patient not taking: Reported on 11/03/2023) 4 each 0   omeprazole  (PRILOSEC) 40 MG capsule Take 1 capsule (40 mg total) by mouth in the morning and at bedtime. (Patient not taking: Reported on 11/03/2023) 60 capsule 5   No current facility-administered  medications for this visit.    Review of Systems: GENERAL: negative for malaise, night sweats HEENT: No changes in hearing or vision, no nose bleeds or other nasal problems. NECK: Negative for lumps, goiter, pain and significant neck swelling RESPIRATORY: Negative for cough, wheezing CARDIOVASCULAR: Negative for chest pain, leg swelling, palpitations, orthopnea GI: SEE HPI MUSCULOSKELETAL: Negative for joint pain or swelling, back pain, and muscle pain. SKIN: Negative for lesions, rash PSYCH: Negative for sleep disturbance, mood disorder and recent psychosocial stressors. HEMATOLOGY Negative for prolonged bleeding, bruising easily, and swollen nodes. ENDOCRINE: Negative for cold or heat intolerance, polyuria, polydipsia and  goiter. NEURO: negative for tremor, gait imbalance, syncope and seizures. The remainder of the review of systems is noncontributory.   Physical Exam: BP 123/72 (BP Location: Left Arm, Patient Position: Sitting, Cuff Size: Normal)   Pulse 81   Temp (!) 97.1 F (36.2 C) (Temporal)   Ht 5' 8 (1.727 m)   Wt 172 lb 8 oz (78.2 kg)   BMI 26.23 kg/m  GENERAL: The patient is AO x3, in no acute distress. HEENT: Head is normocephalic and atraumatic. EOMI are intact. Mouth is well hydrated and without lesions. NECK: Supple. No masses LUNGS: Clear to auscultation. No presence of rhonchi/wheezing/rales. Adequate chest expansion HEART: RRR, normal s1 and s2. ABDOMEN: Soft, nontender, no guarding, no peritoneal signs, and nondistended. BS +. No masses. EXTREMITIES: Without any cyanosis, clubbing, rash, lesions or edema. NEUROLOGIC: AOx3, no focal motor deficit. SKIN: no jaundice, no rashes   Imaging/Labs: as above  I personally reviewed and interpreted the available labs, imaging and endoscopic files.  Impression and Plan: Kyle Everett is a 23 y.o. male with past medical history of eosinophilic esophagitis, ADHD, who presents for evaluation of eosinophilic esophagitis.  Patient has presented a chronic history of eosinophilic esophagitis for at least a decade.  He has been managed in the past with PPI twice daily without adequate response.  Most recently, he was given courses of topical steroids which, which led to significant histological improvement based on most recent endoscopy.  He actually underwent dilation up to 18 mm recently but he reports persistent dysphagia despite achieving greater lumen of his esophagus.  However, he has been off treatment.  Due to poor oral intake he has not been able to keep his weight.  We discussed into detail the pathogenesis, etiology, diagnosis, treatment and prognosis of eosinophilic esophagitis.  At this point, given he is very symptomatic we will proceed with  a repeat EGD to potentially dilate his esophagus.  However, I emphasized about the importance of treating his eosinophilic esophagitis to avoid issues with recurrent stricturing and progression of disease.  As he has failed PPIs in the past, we discussed the possibility of trying Dupixent .  There are some financial difficulties as he is not insured.  Will try to obtain information regarding patient assistance program for his case.  For now, he can try restarting omeprazole  twice a day with an open capsule, as this will decrease the inflammation severity in the esophagus at the time of potential dilation.  We discussed about the potential role of elimination diet.  However, we also discussed that these may not lead to significant symptom improvement at the moment as he is not even able to swallow many of the food groups that should be avoided.  -Start omeprazole  capsules 40 mg twice a day -please open the capsule and  Mix the Granules with applesauce -Schedule EGD -We will inquire about patient assistance program  with Dupixent  -Check CBC and BMP -Can try drinking protein shakes to improve weight.  All questions were answered.      Kyle Fortune, MD Gastroenterology and Hepatology [facility]

## 2023-11-03 NOTE — Patient Instructions (Addendum)
 Start omeprazole  capsules 40 mg twice a day -please open the capsula and  Mix the Granules with applesauce Schedule EGD We will inquire about patient assistance program with Dupixent  Perform blood workup Can try drinking protein shakes to improve weight.

## 2023-11-03 NOTE — Progress Notes (Unsigned)
 Toribio Fortune, M.D. Gastroenterology & Hepatology [facility] [address] [place], [place] [address] Primary Care Physician: [name], FNP [address] [place] [address]  Chief Complaint: Eosinophilic esophagitis  History of Present Illness: Kyle Everett is a 23 y.o. male with past medical history of eosinophilic esophagitis, ADHD, who presents for evaluation of eosinophilic esophagitis.  Patient comes to the office requesting a second opinion regarding eosinophilic esophagitis.  The patient is a former patient of [name].  He was first seen in 2016 after he was presenting 53-month history of dysphagia.  His initial biopsy on 02/22/2015 showed coarse appearance to the esophageal mucosa with linear furrows but no for circumferential rings noted. No obvious narrowing or stricture noted and no resistance noted as scope was passed distally. His esophagus was dilated by passing 48 [place] Maloney dilator.  Biopsy showed marked elevation of eosinophils on biopsies.  She was initially started on montelukast  for management of eosinophilic esophagitis.  Was also advised in follow-up appointments to keep an eye out for the type of food to trigger her symptoms.  Patient eventually started seeing [name] since 2020 for for evaluation of dysphagia.  He presented an episode of food impaction and had placement of an NG tube in the ER.  He was discharged on PPI twice daily.  The patient underwent a subsequent EGD on 11/14/2022 for showing an 11 mm stricture along with multiple changes concerning for eosinophilic esophagitis.  Dilation was performed with a savory dilator up to 12 mm.  Biopsies were taken.  Stomach and duodenum were all normal.  He was advised to continue taking pantoprazole  twice a day and to start fluticasone .  Biopsies showed up to the 30 eosinophils in the  proximal esophagus and up to 20 eosinophils  Per high-power field  in the distal esophagus.  A second endoscopy was performed on 02/27/2023.  This showed presence of active eosinophilic esophagitis as there was edema, rings and exudates with first there was also presence of a stricture which was dilated with a  savory dilator up to 18 mm.  Notably, most recent biopsies showed up to 15  eosinophils per high-power field in the proximal esophagus and only up to 10 in the distal esophagus.    The patient was being followed by [name] after his endoscopies with most recent appointment on 06/02/2023.  Per notes, he was given budesonide  capsules  to be taken as a slurry which he took for 2 months but stopped taking afterwards.  He was advised to follow-up 1 food elimination diet  and to try again budesonide  slurry on top of tonics 40 mg twice a day.  Given recurrent issues with dysphagia, patient requested new treatment and he was started on Dupixent  but he never performed blood workup required prior to starting this medication.  He never started Dupixent .  Patient reports that he is not able to eat much of the foods since he had his first endoscopy in 10/2022. He states he is barely able to eat potatoes, soups and  some chips occasionally. Not drinking any protein shakes.  The patient denies having any nausea, vomiting, fever, chills, hematochezia, melena, hematemesis, abdominal distention, abdominal pain, diarrhea, jaundice, pruritus. He has lost close to 50 lb in the last year.  Past Medical History: Past Medical History:  Diagnosis Date   ADD (attention deficit disorder)    ADHD (attention deficit hyperactivity disorder)    Dysphagia    Eosinophilic esophagitis  Past Surgical History: Past Surgical History:  Procedure Laterality Date   BIOPSY  03/10/2015   Procedure: BIOPSY;  Surgeon: [name], MD;  [facility]: [facility];  Service: Endoscopy;;   BIOPSY  11/14/2022   Procedure: BIOPSY;  Surgeon: [name], MD;  [facility];  Service:  Endoscopy;;   BIOPSY  02/27/2023   Procedure: BIOPSY;  Surgeon: [name], MD;  [facility];  Service: Endoscopy;;   ESOPHAGEAL DILATION N/A 03/10/2015   Procedure: ESOPHAGEAL DILATION WITH 48FR MALONEY DILATOR;  Surgeon: [name], MD;  [facility]: [facility];  Service: Endoscopy;  Laterality: N/A;   ESOPHAGEAL DILATION  02/27/2023   Procedure: ESOPHAGEAL DILATION;  Surgeon: [name], MD;  [facility];  Service: Endoscopy;;   ESOPHAGOGASTRODUODENOSCOPY (EGD) WITH PROPOFOL  N/A 03/10/2015   Procedure: ESOPHAGOGASTRODUODENOSCOPY (EGD) WITH PROPOFOL ;  Surgeon: [name], MD;  [facility]: [facility];  Service: Endoscopy;  Laterality: N/A;   ESOPHAGOGASTRODUODENOSCOPY (EGD) WITH PROPOFOL  N/A 11/14/2022   Procedure: ESOPHAGOGASTRODUODENOSCOPY (EGD) WITH PROPOFOL ;  Surgeon: [name], MD;  [facility];  Service: Endoscopy;  Laterality: N/A;  10:30 am, asa 3   ESOPHAGOGASTRODUODENOSCOPY (EGD) WITH PROPOFOL  N/A 02/27/2023   Procedure: ESOPHAGOGASTRODUODENOSCOPY (EGD) WITH PROPOFOL ;  Surgeon: [name], MD;  [facility];  Service: Endoscopy;  Laterality: N/A;  1:00PM;ASA 1-2   SAVORY DILATION  11/14/2022   Procedure: SAVORY DILATION;  Surgeon: [name], MD;  [facility];  Service: Endoscopy;;    Family History: Family History  Problem Relation Age of Onset   Diabetes Mother    Healthy Father    ADD / ADHD Sister    Thyroid disease Sister     Social History: Social History   Tobacco Use  Smoking Status Never  Smokeless Tobacco Never   Social History   Substance and Sexual Activity  Alcohol Use Not Currently   Social History   Substance and Sexual Activity  Drug Use No    Allergies: No Known Allergies  Medications: Current Outpatient Medications  Medication Sig Dispense Refill   Budesonide  2 MG/10ML SUSP Take 2 mg by mouth in the morning and at bedtime. FDA approved EOE treatment :  Budesonide  oral suspension is 2 mg, twice daily, for 12 weeks . Take budesonide  slowly, over 5 to 10 minutes, and not to eat or drink for 30 minutes after taking the medication. Administered as an oral viscous slurry (4 mg total daily dose, generally divided into twice a day). Viscous budesonide  can be compounded by mixing Pulmicort  Respules with sucralose (Splenda; 10 1-gram packets per 1 mg of budesonide , creating a volume of approximately 8 mL) (Patient not taking: Reported on 11/03/2023) 120 mL 2   Dupilumab  300 MG/2ML SOAJ Inject 300 mg into the skin once a week. (Patient not taking: Reported on 11/03/2023) 8 mL 11   fluticasone  (FLOVENT  HFA) 220 MCG/ACT inhaler Inhale 4 puffs into the lungs 2 (two) times daily. Two puffs 220 metered inhaler  two puffs swallowed. Rinse mouth out. No food or drink for two hours after administration as feasible (Patient not taking: Reported on 11/03/2023) 4 each 0   omeprazole  (PRILOSEC) 40 MG capsule Take 1 capsule (40 mg total) by mouth in the morning and at bedtime. (Patient not taking: Reported on 11/03/2023) 60 capsule 5   No current facility-administered medications for this visit.    Review of Systems: GENERAL: negative for malaise, night sweats HEENT: No  changes in hearing or vision, no nose bleeds or other nasal problems. NECK: Negative for lumps, goiter, pain and significant neck swelling RESPIRATORY: Negative for cough, wheezing CARDIOVASCULAR: Negative for chest pain, leg swelling, palpitations, orthopnea GI: SEE HPI MUSCULOSKELETAL: Negative for joint pain or swelling, back pain, and muscle pain. SKIN: Negative for lesions, rash PSYCH: Negative for sleep disturbance, mood disorder and recent psychosocial stressors. HEMATOLOGY Negative for prolonged bleeding, bruising easily, and swollen nodes. ENDOCRINE: Negative for cold or heat intolerance, polyuria, polydipsia and goiter. NEURO: negative for tremor, gait imbalance, syncope and seizures. The  remainder of the review of systems is noncontributory.   Physical Exam: BP 123/72 (BP Location: Left Arm, Patient Position: Sitting, Cuff Size: Normal)   Pulse 81   Temp (!) 97.1 F (36.2 C) (Temporal)   Ht 5' 8 (1.727 m)   Wt 172 lb 8 oz (78.2 kg)   BMI 26.23 kg/m  GENERAL: The patient is AO x3, in no acute distress. HEENT: Head is normocephalic and atraumatic. EOMI are intact. Mouth is well hydrated and without lesions. NECK: Supple. No masses LUNGS: Clear to auscultation. No presence of rhonchi/wheezing/rales. Adequate chest expansion HEART: RRR, normal s1 and s2. ABDOMEN: Soft, nontender, no guarding, no peritoneal signs, and nondistended. BS +. No masses. RECTAL EXAM: no external lesions, normal tone, no masses, brown stool without blood.*** Chaperone: EXTREMITIES: Without any cyanosis, clubbing, rash, lesions or edema. NEUROLOGIC: AOx3, no focal motor deficit. SKIN: no jaundice, no rashes   Imaging/Labs: as above  I personally reviewed and interpreted the available labs, imaging and endoscopic files.  Impression and Plan: Kyle Everett is a 23 y.o. male with ***   All questions were answered.      Toribio Fortune, MD Gastroenterology and Hepatology [facility]

## 2023-11-07 ENCOUNTER — Telehealth (INDEPENDENT_AMBULATORY_CARE_PROVIDER_SITE_OTHER): Payer: Self-pay

## 2023-11-07 ENCOUNTER — Encounter (HOSPITAL_COMMUNITY)
Admission: RE | Admit: 2023-11-07 | Discharge: 2023-11-07 | Disposition: A | Discharge status: Home / Self Care | Payer: Self-pay | Source: Ambulatory Visit | Attending: Gastroenterology | Admitting: Gastroenterology

## 2023-11-07 NOTE — Telephone Encounter (Signed)
 I gave the patient the patient assistance forms while here on 11/03/2023 for him to fill out he will bring back with income information. Patient states he will fill the forms out and bring back to the office with his income information so we can proceed with getting help for free medication.   Start process for Patient assistance for Dupixent  per Dr Eartha. Patient has no insurance coverage.

## 2023-11-13 ENCOUNTER — Ambulatory Visit (HOSPITAL_COMMUNITY)
Admission: RE | Admit: 2023-11-13 | Discharge: 2023-11-13 | Disposition: A | Discharge status: Home / Self Care | Payer: Self-pay | Attending: Gastroenterology | Admitting: Gastroenterology

## 2023-11-13 ENCOUNTER — Encounter (HOSPITAL_COMMUNITY)
Admission: RE | Disposition: A | Discharge status: Home / Self Care | Payer: Self-pay | Source: Home / Self Care | Attending: Gastroenterology

## 2023-11-13 ENCOUNTER — Other Ambulatory Visit: Payer: Self-pay

## 2023-11-13 ENCOUNTER — Ambulatory Visit (HOSPITAL_COMMUNITY): Payer: Self-pay | Admitting: Anesthesiology

## 2023-11-13 ENCOUNTER — Encounter (HOSPITAL_COMMUNITY): Payer: Self-pay | Admitting: Gastroenterology

## 2023-11-13 DIAGNOSIS — Z79899 Other long term (current) drug therapy: Secondary | ICD-10-CM | POA: Insufficient documentation

## 2023-11-13 DIAGNOSIS — R1310 Dysphagia, unspecified: Secondary | ICD-10-CM | POA: Insufficient documentation

## 2023-11-13 DIAGNOSIS — K2980 Duodenitis without bleeding: Secondary | ICD-10-CM | POA: Insufficient documentation

## 2023-11-13 DIAGNOSIS — K200 Eosinophilic esophagitis: Secondary | ICD-10-CM | POA: Insufficient documentation

## 2023-11-13 DIAGNOSIS — K2289 Other specified disease of esophagus: Secondary | ICD-10-CM

## 2023-11-13 HISTORY — PX: ESOPHAGOGASTRODUODENOSCOPY: SHX5428

## 2023-11-13 HISTORY — PX: SAVORY DILATION: SHX5439

## 2023-11-13 SURGERY — EGD (ESOPHAGOGASTRODUODENOSCOPY)
Anesthesia: General

## 2023-11-13 MED ORDER — PROPOFOL 500 MG/50ML IV EMUL
INTRAVENOUS | Status: DC | PRN
  Administered 2023-11-13: 100 mg via INTRAVENOUS
  Administered 2023-11-13: 50 mg via INTRAVENOUS
  Administered 2023-11-13: 100 mg via INTRAVENOUS
  Administered 2023-11-13: 150 mg via INTRAVENOUS

## 2023-11-13 MED ORDER — LIDOCAINE 2% (20 MG/ML) 5 ML SYRINGE
INTRAMUSCULAR | Status: DC | PRN
  Administered 2023-11-13: 100 mg via INTRAVENOUS

## 2023-11-13 MED ORDER — LACTATED RINGERS IV SOLN: INTRAVENOUS | Status: DC

## 2023-11-13 MED ORDER — EPHEDRINE SULFATE-NACL 50-0.9 MG/10ML-% IV SOSY
PREFILLED_SYRINGE | INTRAVENOUS | Status: DC | PRN
  Administered 2023-11-13: 7.5 mg via INTRAVENOUS

## 2023-11-13 MED ORDER — LIDOCAINE VISCOUS HCL 2 % MT SOLN: 15.0000 mL | Freq: Three times a day (TID) | OROMUCOSAL | 1 refills | Status: AC | PRN

## 2023-11-13 NOTE — Anesthesia Preprocedure Evaluation (Signed)
 Anesthesia Evaluation  Patient identified by MRN, date of birth, ID band Patient awake    Reviewed: Allergy & Precautions, H&P , NPO status , Patient's Chart, lab work & pertinent test results, reviewed documented beta blocker date and time   Airway Mallampati: II  TM Distance: >3 FB Neck ROM: full    Dental no notable dental hx.    Pulmonary neg pulmonary ROS   Pulmonary exam normal breath sounds clear to auscultation       Cardiovascular Exercise Tolerance: Good hypertension, negative cardio ROS  Rhythm:regular Rate:Normal     Neuro/Psych  PSYCHIATRIC DISORDERS      negative neurological ROS     GI/Hepatic negative GI ROS, Neg liver ROS,,,  Endo/Other  negative endocrine ROS    Renal/GU negative Renal ROS  negative genitourinary   Musculoskeletal   Abdominal   Peds  Hematology negative hematology ROS (+)   Anesthesia Other Findings   Reproductive/Obstetrics negative OB ROS                              Anesthesia Physical Anesthesia Plan  ASA: 2  Anesthesia Plan: General   Post-op Pain Management:    Induction:   PONV Risk Score and Plan: Propofol  infusion  Airway Management Planned:   Additional Equipment:   Intra-op Plan:   Post-operative Plan:   Informed Consent: I have reviewed the patients History and Physical, chart, labs and discussed the procedure including the risks, benefits and alternatives for the proposed anesthesia with the patient or authorized representative who has indicated his/her understanding and acceptance.     Dental Advisory Given  Plan Discussed with: CRNA  Anesthesia Plan Comments:         Anesthesia Quick Evaluation

## 2023-11-13 NOTE — Anesthesia Procedure Notes (Signed)
 Date/Time: 11/13/2023 8:27 AM  Performed by: Barbarann Verneita RAMAN, CRNAPre-anesthesia Checklist: Patient identified, Emergency Drugs available, Suction available, Timeout performed and Patient being monitored Patient Re-evaluated:Patient Re-evaluated prior to induction Oxygen Delivery Method: Nasal Cannula

## 2023-11-13 NOTE — Op Note (Signed)
 [facility] Patient Name: Kyle Everett Procedure Date: 11/13/2023 8:18 AM MRN: [id number] Date of Birth: 2001-02-07 Attending MD: Toribio Fortune , , [id number] CSN: [id number] Age: 23 Admit Type: Outpatient Procedure:                Upper GI endoscopy Indications:              Dysphagia, Eosinophilic esophagitis Providers:                Toribio Fortune, Jon LABOR. Museum/gallery exhibitions officer, Charity fundraiser,                            Mickel CROME. Shirlean Balm, Technician Referring MD:              Medicines:                Monitored Anesthesia Care Complications:            No immediate complications. Estimated Blood Loss:     Estimated blood loss: none. Procedure:                Pre-Anesthesia Assessment:                           - Prior to the procedure, a History and Physical                            was performed, and patient medications, allergies                            and sensitivities were reviewed. The patient's                            tolerance of previous anesthesia was reviewed.                           - The risks and benefits of the procedure and the                            sedation options and risks were discussed with the                            patient. All questions were answered and informed                            consent was obtained.                           - ASA Grade Assessment: I - A normal, healthy                            patient.                           After obtaining informed consent, the endoscope was                            passed under direct vision. Throughout the  procedure, the patient's blood pressure, pulse, and                            oxygen saturations were monitored continuously. The                            GIF-H190 ([id number]) scope was introduced through the                            mouth, and advanced to the second part of duodenum.                            The upper GI endoscopy was accomplished  without                            difficulty. The patient tolerated the procedure                            well. Scope In: 8:32:46 AM Scope Out: 8:45:13 AM Total Procedure Duration: 0 hours 12 minutes 27 seconds  Findings:      Mucosal changes were found in the entire esophagus. Esophageal findings       were graded using the Eosinophilic Esophagitis Endoscopic Reference       Score (EoE-EREFS) as: Edema Grade 0 Normal (distinct vascular markings),       Rings Grade 0 None (no ridges or rings seen), Exudates Grade 1 Mild       (scattered white lesions involving less than 10 percent of the       esophageal surface area), Furrows Grade 2 Severe (vertical lines with       clear depth) and Stricture none (no stricture found). A guidewire was       placed and the scope was withdrawn. Dilation was performed with a Savary       dilator with mild resistance at 16 mm. The dilation site was examined       following endoscope reinsertion and showed moderate mucosal disruption.      The stomach was normal.      Patchy mild inflammation characterized by congestion (edema) and       erythema was found in the first portion of the duodenum. Biopsies were       taken with a cold forceps for histology. Impression:               - Esophageal mucosal changes consistent with                            eosinophilic esophagitis. Dilated.                           - Normal stomach.                           - Duodenitis. Biopsied. Moderate Sedation:      Per Anesthesia Care Recommendation:           - Discharge patient to home (ambulatory).                           -  Resume previous diet.                           - Await pathology results.                           - Use Prilosec (omeprazole ) 40 mg PO BID. Swallow                            granules from capsule.                           - Please bring forms to our office for [employer]                             assistance program. Procedure Code(s):         --- Professional ---                           [phone], Esophagogastroduodenoscopy, flexible,                            transoral; with insertion of guide wire followed by                            passage of dilator(s) through esophagus over guide                            wire                           43239, 59, Esophagogastroduodenoscopy, flexible,                            transoral; with biopsy, single or multiple Diagnosis Code(s):        --- Professional ---                           K22.89, Other specified disease of esophagus                           K29.80, Duodenitis without bleeding                           R13.10, Dysphagia, unspecified                           K20.0, Eosinophilic esophagitis CPT copyright 2022 American Medical Association. All rights reserved. The codes documented in this report are preliminary and upon coder review may  be revised to meet current compliance requirements. Toribio Fortune, MD Toribio Fortune,  11/13/2023 8:55:13 AM This report has been signed electronically. Number of Addenda: 0

## 2023-11-13 NOTE — Transfer of Care (Signed)
 Immediate Anesthesia Transfer of Care Note  Patient: Kyle Everett  Procedure(s) Performed: EGD (ESOPHAGOGASTRODUODENOSCOPY) EGD, WITH DILATION USING SAVARY-GILLIARD DILATOR OVER GUIDEWIRE  Patient Location: PACU  Anesthesia Type:General  Level of Consciousness: drowsy  Airway & Oxygen Therapy: Patient Spontanous Breathing  Post-op Assessment: Report given to RN and Post -op Vital signs reviewed and stable  Post vital signs: Reviewed and stable  Last Vitals:  Vitals Value Taken Time  BP 95/49 11/13/23   0855  Temp 97.5 11/13/23  0855  Pulse 66 11/13/23 08:53  Resp 19 11/13/23 08:53  SpO2 99 % 11/13/23 08:53    Last Pain:  Vitals:   11/13/23 0853  TempSrc: Oral  PainSc: 0-No pain         Complications: No notable events documented.

## 2023-11-13 NOTE — Discharge Instructions (Addendum)
 You are being discharged to home.  Resume your previous diet.  We are waiting for your pathology results.  Please take your Prilosec (omeprazole ) 40 mg by mouth twice a day. Swallow granules from capsule. Please bring forms to our office for [employer]  assistance program. Can take magic mouthwash as needed for throat/chest pain.

## 2023-11-13 NOTE — Interval H&P Note (Signed)
 History and Physical Interval Note:  11/13/2023 7:35 AM  Kyle Everett  has presented today for surgery, with the diagnosis of DYSPHAGIA, EOSINOPHILIC ESOPHAGUS.  The various methods of treatment have been discussed with the patient and family. After consideration of risks, benefits and other options for treatment, the patient has consented to  Procedure(s) with comments: EGD (ESOPHAGOGASTRODUODENOSCOPY) (N/A) - 815am, asa 1-2 as a surgical intervention.  The patient's history has been reviewed, patient examined, no change in status, stable for surgery.  I have reviewed the patient's chart and labs.  Questions were answered to the patient's satisfaction.     Tykel Badie Castaneda Mayorga

## 2023-11-14 ENCOUNTER — Encounter (HOSPITAL_COMMUNITY): Payer: Self-pay | Admitting: Gastroenterology

## 2023-11-14 LAB — SURGICAL PATHOLOGY

## 2023-11-15 NOTE — Anesthesia Postprocedure Evaluation (Signed)
 Anesthesia Post Note  Patient: Kyle Everett  Procedure(s) Performed: EGD (ESOPHAGOGASTRODUODENOSCOPY) EGD, WITH DILATION USING SAVARY-GILLIARD DILATOR OVER GUIDEWIRE  Patient location during evaluation: Phase II Anesthesia Type: General Level of consciousness: awake Pain management: pain level controlled Vital Signs Assessment: post-procedure vital signs reviewed and stable Respiratory status: spontaneous breathing and respiratory function stable Cardiovascular status: blood pressure returned to baseline and stable Postop Assessment: no headache and no apparent nausea or vomiting Anesthetic complications: no Comments: Late entry   No notable events documented.   Last Vitals:  Vitals:   11/13/23 0713 11/13/23 0853  BP: 117/67 (!) 94/41  Pulse: (!) 50 66  Resp: 18 19  Temp: 36.6 C   SpO2: 100% 99%    Last Pain:  Vitals:   11/13/23 0853  TempSrc: Oral  PainSc: 0-No pain                 Yvonna JINNY Bosworth

## 2023-11-17 ENCOUNTER — Ambulatory Visit (INDEPENDENT_AMBULATORY_CARE_PROVIDER_SITE_OTHER): Payer: Self-pay | Admitting: Gastroenterology

## 2023-11-18 NOTE — Telephone Encounter (Signed)
 Pt mom brought patient assistance forms by and application for [facility] Financial assistance. Will place on provider desk for signature. Will fax assistance forms and clinical information once forms are signed.

## 2023-11-18 NOTE — Telephone Encounter (Signed)
 Patient assistance form and [facility] Financial Assistance form faxed to [phone]

## 2023-11-21 NOTE — Progress Notes (Signed)
 Patient result letter mailed Patient's PCP is on EPIC

## 2023-11-26 NOTE — Telephone Encounter (Signed)
 I called both phone numbers and left a message to return call to the office.

## 2023-11-26 NOTE — Telephone Encounter (Signed)
 I spoke with Charlies GRADE at [employer] she says the patient was denied due to not meeting financial criteria. As the patient states on the application that there is only one in the household and the income is $57,000. She says if the patient can call them and let them know if this is wrong and can provide more people in the household or less income for the one person in the household they can call them and update this information and they can reconcider the denial with the new information. Renee to fax over the denial.

## 2023-11-28 ENCOUNTER — Encounter (INDEPENDENT_AMBULATORY_CARE_PROVIDER_SITE_OTHER): Payer: Self-pay

## 2023-11-28 NOTE — Telephone Encounter (Signed)
 After multiple calls and My Chart messages I have mailed the following letter out to the patient along with the denial letter from [employer] [address].    November 28, 2023   Kyle Everett [address] [place] [address]   Dear [name],   Hello Kyle Everett, I have tried reaching you by phone, and have left you several messages there. I wanted to reach out to you and let you know that unfortunately you were denied any type of assistance in receiving the [employer] .  They state that you do not meet the financial criteria. They say that the application states there is one person in the household and the income was  $57,000. She says if the you can call them and let them know if this is wrong and can provide more people in the household or less income for the one person in the household you can call them and update this information and they can reconcider the denial with the new information.   Thanks,   [employer]

## 2023-11-28 NOTE — Telephone Encounter (Signed)
 Thanks Crystal.  Please ask Kyle Everett to make a follow up appointment for him in next available.  Thanks

## 2023-11-28 NOTE — Telephone Encounter (Signed)
 I called and left a vm asked that the patient please return call to the office.

## 2023-11-28 NOTE — Telephone Encounter (Signed)
Noted. Thanks,

## 2023-12-15 ENCOUNTER — Ambulatory Visit (INDEPENDENT_AMBULATORY_CARE_PROVIDER_SITE_OTHER): Payer: Self-pay | Admitting: Gastroenterology

## 2023-12-16 ENCOUNTER — Encounter (INDEPENDENT_AMBULATORY_CARE_PROVIDER_SITE_OTHER): Payer: Self-pay | Admitting: Gastroenterology

## 2024-02-04 ENCOUNTER — Encounter (INDEPENDENT_AMBULATORY_CARE_PROVIDER_SITE_OTHER): Payer: Self-pay | Admitting: Gastroenterology
